# Patient Record
Sex: Male | Born: 1970 | Race: White | Hispanic: No | Marital: Single | Smoking: Current some day smoker
Health system: Southern US, Community
[De-identification: ages and names within clinical notes are randomized; demographics above are authoritative.]

## PROBLEM LIST (undated history)

## (undated) DIAGNOSIS — G4733 Obstructive sleep apnea (adult) (pediatric): Secondary | ICD-10-CM

## (undated) DIAGNOSIS — J45909 Unspecified asthma, uncomplicated: Secondary | ICD-10-CM

## (undated) DIAGNOSIS — F32A Depression, unspecified: Secondary | ICD-10-CM

## (undated) DIAGNOSIS — F419 Anxiety disorder, unspecified: Secondary | ICD-10-CM

## (undated) DIAGNOSIS — E119 Type 2 diabetes mellitus without complications: Secondary | ICD-10-CM

## (undated) DIAGNOSIS — J449 Chronic obstructive pulmonary disease, unspecified: Secondary | ICD-10-CM

## (undated) DIAGNOSIS — R569 Unspecified convulsions: Secondary | ICD-10-CM

## (undated) DIAGNOSIS — F329 Major depressive disorder, single episode, unspecified: Secondary | ICD-10-CM

## (undated) DIAGNOSIS — I10 Essential (primary) hypertension: Secondary | ICD-10-CM

## (undated) HISTORY — DX: Depression, unspecified: F32.A

## (undated) HISTORY — DX: Anxiety disorder, unspecified: F41.9

---
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
## 2018-12-02 ENCOUNTER — Emergency Department (HOSPITAL_COMMUNITY): Payer: Self-pay

## 2018-12-02 ENCOUNTER — Emergency Department (HOSPITAL_COMMUNITY)
Admission: EM | Admit: 2018-12-02 | Discharge: 2018-12-02 | Disposition: A | Discharge status: Home / Self Care | Payer: Self-pay | Attending: Emergency Medicine | Admitting: Emergency Medicine

## 2018-12-02 ENCOUNTER — Other Ambulatory Visit: Payer: Self-pay

## 2018-12-02 ENCOUNTER — Encounter (HOSPITAL_COMMUNITY): Payer: Self-pay | Admitting: *Deleted

## 2018-12-02 DIAGNOSIS — Z79899 Other long term (current) drug therapy: Secondary | ICD-10-CM | POA: Insufficient documentation

## 2018-12-02 DIAGNOSIS — J449 Chronic obstructive pulmonary disease, unspecified: Secondary | ICD-10-CM | POA: Insufficient documentation

## 2018-12-02 DIAGNOSIS — I10 Essential (primary) hypertension: Secondary | ICD-10-CM | POA: Insufficient documentation

## 2018-12-02 DIAGNOSIS — R109 Unspecified abdominal pain: Secondary | ICD-10-CM | POA: Insufficient documentation

## 2018-12-02 DIAGNOSIS — K529 Noninfective gastroenteritis and colitis, unspecified: Secondary | ICD-10-CM

## 2018-12-02 DIAGNOSIS — Z7984 Long term (current) use of oral hypoglycemic drugs: Secondary | ICD-10-CM | POA: Insufficient documentation

## 2018-12-02 DIAGNOSIS — J45909 Unspecified asthma, uncomplicated: Secondary | ICD-10-CM | POA: Insufficient documentation

## 2018-12-02 DIAGNOSIS — E860 Dehydration: Secondary | ICD-10-CM | POA: Insufficient documentation

## 2018-12-02 DIAGNOSIS — E119 Type 2 diabetes mellitus without complications: Secondary | ICD-10-CM | POA: Insufficient documentation

## 2018-12-02 HISTORY — DX: Unspecified asthma, uncomplicated: J45.909

## 2018-12-02 HISTORY — DX: Chronic obstructive pulmonary disease, unspecified: J44.9

## 2018-12-02 HISTORY — DX: Essential (primary) hypertension: I10

## 2018-12-02 HISTORY — DX: Type 2 diabetes mellitus without complications: E11.9

## 2018-12-02 LAB — URINALYSIS, ROUTINE W REFLEX MICROSCOPIC
Bacteria, UA: NONE SEEN
Bilirubin Urine: NEGATIVE
Glucose, UA: >=500 mg/dL — AB
Hgb urine dipstick: NEGATIVE
Ketones, ur: 20 mg/dL — AB
Leukocytes,Ua: NEGATIVE
Nitrite: NEGATIVE
Protein, ur: NEGATIVE mg/dL
Specific Gravity, Urine: >1.046 — ABNORMAL HIGH (ref 1.005–1.030)
pH: 6 (ref 5.0–8.0)

## 2018-12-02 LAB — CBC WITH DIFFERENTIAL/PLATELET
Abs Immature Granulocytes: 0.06 10*3/uL (ref 0.00–0.07)
Basophils Absolute: 0.1 10*3/uL (ref 0.0–0.1)
Basophils Relative: 0 %
Eosinophils Absolute: 0.2 10*3/uL (ref 0.0–0.5)
Eosinophils Relative: 1 %
HCT: 49.8 % (ref 39.0–52.0)
Hemoglobin: 16.3 g/dL (ref 13.0–17.0)
Immature Granulocytes: 0 %
Lymphocytes Relative: 6 %
Lymphs Abs: 1 10*3/uL (ref 0.7–4.0)
MCH: 29.7 pg (ref 26.0–34.0)
MCHC: 32.7 g/dL (ref 30.0–36.0)
MCV: 90.7 fL (ref 80.0–100.0)
Monocytes Absolute: 1.1 10*3/uL — ABNORMAL HIGH (ref 0.1–1.0)
Monocytes Relative: 6 %
Neutro Abs: 14.8 10*3/uL — ABNORMAL HIGH (ref 1.7–7.7)
Neutrophils Relative %: 87 %
Platelets: 376 10*3/uL (ref 150–400)
RBC: 5.49 MIL/uL (ref 4.22–5.81)
RDW: 12.2 % (ref 11.5–15.5)
WBC: 17.2 10*3/uL — ABNORMAL HIGH (ref 4.0–10.5)
nRBC: 0 % (ref 0.0–0.2)

## 2018-12-02 LAB — COMPREHENSIVE METABOLIC PANEL
ALT: 31 U/L (ref 0–44)
AST: 16 U/L (ref 15–41)
Albumin: 4.2 g/dL (ref 3.5–5.0)
Alkaline Phosphatase: 83 U/L (ref 38–126)
Anion gap: 14 (ref 5–15)
BUN: 14 mg/dL (ref 6–20)
CO2: 23 mmol/L (ref 22–32)
Calcium: 9 mg/dL (ref 8.9–10.3)
Chloride: 97 mmol/L — ABNORMAL LOW (ref 98–111)
Creatinine, Ser: 0.66 mg/dL (ref 0.61–1.24)
GFR calc Af Amer: >60 mL/min (ref >60)
GFR calc non Af Amer: >60 mL/min (ref >60)
Glucose, Bld: 381 mg/dL — ABNORMAL HIGH (ref 70–99)
Potassium: 3.9 mmol/L (ref 3.5–5.1)
Sodium: 134 mmol/L — ABNORMAL LOW (ref 135–145)
Total Bilirubin: 0.8 mg/dL (ref 0.3–1.2)
Total Protein: 7.3 g/dL (ref 6.5–8.1)

## 2018-12-02 LAB — RAPID URINE DRUG SCREEN, HOSP PERFORMED
Amphetamines: NOT DETECTED
Barbiturates: NOT DETECTED
Benzodiazepines: NOT DETECTED
Cocaine: NOT DETECTED
Opiates: POSITIVE — AB
Tetrahydrocannabinol: POSITIVE — AB

## 2018-12-02 LAB — LIPASE, BLOOD: Lipase: 27 U/L (ref 11–51)

## 2018-12-02 LAB — LACTIC ACID, PLASMA
Lactic Acid, Venous: 1.9 mmol/L (ref 0.5–1.9)
Lactic Acid, Venous: 1.7 mmol/L (ref 0.5–1.9)

## 2018-12-02 MED ORDER — INSULIN ASPART 100 UNIT/ML ~~LOC~~ SOLN
8.0000 [IU] | Freq: Once | SUBCUTANEOUS | Status: AC
  Administered 2018-12-02: 15:00:00 8 [IU] via SUBCUTANEOUS
  Filled 2018-12-02: qty 1

## 2018-12-02 MED ORDER — LORAZEPAM 1 MG PO TABS
1.0000 mg | ORAL_TABLET | Freq: Once | ORAL | Status: AC
  Administered 2018-12-02: 1 mg via ORAL
  Filled 2018-12-02: qty 1

## 2018-12-02 MED ORDER — MORPHINE SULFATE (PF) 2 MG/ML IV SOLN
2.0000 mg | Freq: Once | INTRAVENOUS | Status: AC
  Administered 2018-12-02: 12:00:00 2 mg via INTRAVENOUS
  Filled 2018-12-02: qty 1

## 2018-12-02 MED ORDER — ONDANSETRON HCL 4 MG/2ML IJ SOLN
4.0000 mg | Freq: Once | INTRAMUSCULAR | Status: AC
  Administered 2018-12-02: 4 mg via INTRAVENOUS
  Filled 2018-12-02: qty 2

## 2018-12-02 MED ORDER — IOHEXOL 300 MG/ML  SOLN
125.0000 mL | Freq: Once | INTRAMUSCULAR | Status: AC | PRN
  Administered 2018-12-02: 125 mL via INTRAVENOUS

## 2018-12-02 MED ORDER — MORPHINE SULFATE (PF) 2 MG/ML IV SOLN
2.0000 mg | Freq: Once | INTRAVENOUS | Status: AC
  Administered 2018-12-02: 2 mg via INTRAVENOUS
  Filled 2018-12-02: qty 1

## 2018-12-02 MED ORDER — DICYCLOMINE HCL 20 MG PO TABS: 20.0000 mg | ORAL_TABLET | Freq: Two times a day (BID) | ORAL | 0 refills | Status: DC

## 2018-12-02 MED ORDER — IOHEXOL 300 MG/ML  SOLN
30.0000 mL | Freq: Once | INTRAMUSCULAR | Status: AC | PRN
  Administered 2018-12-02: 12:00:00 30 mL via ORAL

## 2018-12-02 MED ORDER — DICYCLOMINE HCL 10 MG PO CAPS: 10.0000 mg | ORAL_CAPSULE | Freq: Once | ORAL | Status: DC

## 2018-12-02 MED ORDER — ONDANSETRON HCL 4 MG PO TABS: 4.0000 mg | ORAL_TABLET | Freq: Four times a day (QID) | ORAL | 0 refills | Status: DC

## 2018-12-02 MED ORDER — SODIUM CHLORIDE 0.9 % IV BOLUS
1000.0000 mL | Freq: Once | INTRAVENOUS | Status: AC
  Administered 2018-12-02: 12:00:00 1000 mL via INTRAVENOUS

## 2018-12-02 MED ORDER — DICYCLOMINE HCL 10 MG PO CAPS
20.0000 mg | ORAL_CAPSULE | Freq: Once | ORAL | Status: AC
  Administered 2018-12-02: 20 mg via ORAL
  Filled 2018-12-02: qty 2

## 2018-12-02 MED ORDER — SODIUM CHLORIDE 0.9 % IV BOLUS: 1000.0000 mL | Freq: Once | INTRAVENOUS | Status: DC

## 2018-12-02 MED ORDER — SODIUM CHLORIDE 0.9 % IV SOLN: INTRAVENOUS | Status: DC

## 2018-12-02 NOTE — ED Notes (Signed)
Patient transported to CT 

## 2018-12-02 NOTE — Discharge Instructions (Addendum)
Please review information on resources for primary care.  Please establish primary care in this area and restart your diabetes medications that are being mailed to you.  Please drink plenty of water.  Abstain from using marijuana. I prescribed you zofran for nausea and bentyl for the pain and stomach spasms.   Please return if you have any new or concerning symptoms such as fever, new or worsening pain, or new or concerning symptoms.

## 2018-12-02 NOTE — ED Triage Notes (Signed)
Patient presents to the ED for nausea, vomiting, diarrhea since today.

## 2018-12-02 NOTE — ED Provider Notes (Addendum)
[facility] EMERGENCY DEPARTMENT Provider Note   CSN: [id number] Arrival date & time: 12/02/18  1053     History   Chief Complaint Chief Complaint  Patient presents with  . Emesis    HPI Johnathan Smith is a 48 y.o. male history of asthma, diabetes, bipolar, COPD presenting for NV, abdominal pain and diarrhea.   Patient complains of sharp stabbing abdominal pain that started this morning he has since vomited x4 and had diarrhea x8 described as watery no blood in his emesis or diarrhea.  Patient states he also has abdominal distention since this morning and feels that his abdomen is tight.  States that he was hospitalized 2 months ago for similar presentation but is unsure what the diagnosis was although he was treated with antibiotics. States pain is better with laying down.    Patient states he drinks a sixpack of beer a week but approximately 2 weeks ago he was an everyday drinker.  States he intermittently smokes.  Denies any drug use.  Patient states that he recently moved to the area and states that some of his medications are being shipped to him.  States that he takes his Metformin every day but has not has his glipizide in 3 to 4 days. Patient states he urinates frequently as he is a diabetic and is uncertain of his blood sugar he does not have glucometer. Uncertain of when he last had bloodwork done.   Denies any chest pain, shortness of breath, headache, dizziness, no dysuria.     HPI  Past Medical History:  Diagnosis Date  . Asthma   . COPD (chronic obstructive pulmonary disease) (HCC)   . Diabetes mellitus without complication (HCC)   . Hypertension     There are no active problems to display for this patient.   History reviewed. No pertinent surgical history.      Home Medications    Prior to Admission medications   Medication Sig Start Date End Date Taking? Authorizing Provider  acetaminophen (TYLENOL) 325 MG tablet Take 650 mg by mouth every 6 (six) hours  as needed.   Yes [provider]  albuterol (VENTOLIN HFA) 108 (90 Base) MCG/ACT inhaler Inhale 1-2 puffs into the lungs every 6 (six) hours as needed for wheezing or shortness of breath.   Yes [provider]  atorvastatin (LIPITOR) 10 MG tablet Take 10 mg by mouth daily.   Yes [provider]  Dulaglutide (TRULICITY) 1.5 MG/0.5ML SOPN Inject 1.5 mg into the skin once a week.    Yes [provider]  glimepiride (AMARYL) 4 MG tablet Take 8 mg by mouth daily with breakfast.    Yes [provider]  lisinopril (ZESTRIL) 40 MG tablet Take 40 mg by mouth daily.   Yes [provider]  metFORMIN (GLUCOPHAGE) 1000 MG tablet Take 1,000 mg by mouth 2 (two) times daily with a meal.   Yes [provider]  mometasone-formoterol (DULERA) 200-5 MCG/ACT AERO Inhale 2 puffs into the lungs 2 (two) times daily.   Yes [provider]  omeprazole (PRILOSEC) 40 MG capsule Take 40 mg by mouth daily.   Yes [provider]  tiotropium (SPIRIVA) 18 MCG inhalation capsule Place 18 mcg into inhaler and inhale daily.   Yes [provider]  dicyclomine (BENTYL) 20 MG tablet Take 1 tablet (20 mg total) by mouth 2 (two) times daily. 12/02/18   Emeree Mahler, Rodrigo Ran, PA  ondansetron (ZOFRAN) 4 MG tablet Take 1 tablet (4 mg total)  by mouth every 6 (six) hours. 12/02/18   Tedd Sias, PA    Family History History reviewed. No pertinent family history.  Social History Social History   Tobacco Use  . Smoking status: Current Some Day Smoker  . Smokeless tobacco: Never Used  Substance Use Topics  . Alcohol use: Yes    Alcohol/week: 6.0 standard drinks    Types: 6 Cans of beer per week    Comment: a week  . Drug use: Yes    Types: Marijuana     Allergies   Banana   Review of Systems Review of Systems  Constitutional: Negative for fever.  HENT: Negative for congestion.   Respiratory: Negative for shortness of breath.    Cardiovascular: Negative for chest pain.  Gastrointestinal: Positive for abdominal distention, abdominal pain, diarrhea, nausea and vomiting.  Genitourinary: Positive for frequency. Negative for dysuria and urgency.  Musculoskeletal: Negative for neck pain.  Skin: Negative for rash.  Neurological: Negative for dizziness.     Physical Exam Updated Vital Signs BP 126/64 (BP Location: Left Wrist)   Pulse (!) 104   Temp 98.1 F (36.7 C) (Oral)   Resp (!) 22   Ht 5\' 9"  (1.753 m)   Wt 123.8 kg   SpO2 96%   BMI 40.32 kg/m   Physical Exam Vitals signs and nursing note reviewed.  Constitutional:      Appearance: He is not ill-appearing.  HENT:     Head: Normocephalic and atraumatic.  Eyes:     General: No scleral icterus. Neck:     Musculoskeletal: No neck rigidity.  Cardiovascular:     Rate and Rhythm: Regular rhythm. Tachycardia present.     Pulses: Normal pulses.     Heart sounds: Normal heart sounds.  Pulmonary:     Effort: Pulmonary effort is normal.     Breath sounds: Normal breath sounds.  Abdominal:     General: Abdomen is flat. There is distension.     Palpations: Abdomen is soft.     Tenderness: There is abdominal tenderness. There is guarding and rebound. There is no right CVA tenderness or left CVA tenderness.     Comments: Umbilical tenderness, guarding, rebound.  Abdomen is distended without rigidity.  Musculoskeletal:     Right lower leg: No edema.     Left lower leg: No edema.  Skin:    General: Skin is warm and dry.     Capillary Refill: Capillary refill takes less than 2 seconds.  Neurological:     Mental Status: He is alert. Mental status is at baseline.  Psychiatric:        Behavior: Behavior normal.      ED Treatments / Results  Labs (all labs ordered are listed, but only abnormal results are displayed) Labs Reviewed  COMPREHENSIVE METABOLIC PANEL - Abnormal; Notable for the following components:      Result Value   Sodium 134 (*)     Chloride 97 (*)    Glucose, Bld 381 (*)    All other components within normal limits  CBC WITH DIFFERENTIAL/PLATELET - Abnormal; Notable for the following components:   WBC 17.2 (*)    Neutro Abs 14.8 (*)    Monocytes Absolute 1.1 (*)    All other components within normal limits  URINALYSIS, ROUTINE W REFLEX MICROSCOPIC - Abnormal; Notable for the following components:   Specific Gravity, Urine >1.046 (*)    Glucose, UA >=500 (*)    Ketones, ur 20 (*)  All other components within normal limits  RAPID URINE DRUG SCREEN, HOSP PERFORMED - Abnormal; Notable for the following components:   Opiates POSITIVE (*)    Tetrahydrocannabinol POSITIVE (*)    All other components within normal limits  LIPASE, BLOOD  LACTIC ACID, PLASMA  LACTIC ACID, PLASMA    EKG None  Radiology Ct Abdomen Pelvis W Contrast  Result Date: 12/02/2018 CLINICAL DATA:  Abdominal pain with nausea and vomiting oral contrast was also administered. EXAM: CT ABDOMEN AND PELVIS WITH CONTRAST TECHNIQUE: Multidetector CT imaging of the abdomen and pelvis was performed using the standard protocol following bolus administration of intravenous contrast. CONTRAST:  OMNIPAQUE IOHEXOL 300 MG/ML  SOLN COMPARISON:  None. FINDINGS: Lower chest: There is no lung base edema or consolidation. There is slight lower lobe bronchiectatic change bilaterally. Hepatobiliary: No focal liver lesions are apparent. Gallbladder wall is not appreciably thickened. There is no biliary duct dilatation. Pancreas: There is no pancreatic mass or inflammatory focus. Spleen: No splenic lesions are evident. Adrenals/Urinary Tract: Adrenals bilaterally appear unremarkable. Kidneys bilaterally show no evident mass or hydronephrosis on either side. There is a 2 mm calculus in the lower pole of the right kidney. No ureteral calculi evident on either side. Urinary bladder is midline with wall thickness within normal limits. Stomach/Bowel: Most small bowel loops  are fluid filled. There is wall thickening throughout multiple loops of distal jejunum and ileum. There is no transition zone to suggest bowel obstruction. The terminal ileum appears within normal limits. There are occasional sigmoid diverticula without diverticulitis. There is no free air or portal venous air. Vascular/Lymphatic: There is no abdominal aortic aneurysm. No vascular lesions are evident. There is a circumaortic left renal vein, an anatomic variant. Major venous structures appear patent. There is no adenopathy in the abdomen or pelvis. Reproductive: Prostate and seminal vesicles are normal in size and contour. There are a few tiny prostatic calculi. No evident pelvic mass. Other: The appendix appears normal. There is no abscess or ascites in the abdomen or pelvis. There is a small amount of fat in the umbilicus. Musculoskeletal: There is vacuum phenomenon at L5-S1. No blastic or lytic bone lesions are evident. There is no intramuscular lesions. IMPRESSION: 1. There is wall thickening throughout multiple loops of distal jejunum and ileum with no transition zone to suggest bowel obstruction. This could represent an infectious or inflammatory enteritis, potentially with a degree of ileus. 2. There are occasional sigmoid diverticula without evidence of diverticulitis. 3. 2 mm calculus lower pole right kidney. No hydronephrosis or ureteral calculus on either side. Urinary bladder wall thickness normal. Tiny prostatic calculi evident. 4. Appendix appears normal. Electronically Signed   By: Bretta Bang III M.D.   On: 12/02/2018 13:35    Procedures Procedures (including critical care time)  Medications Ordered in ED Medications  sodium chloride 0.9 % bolus 1,000 mL (0 mLs Intravenous Stopped 12/02/18 1313)    And  0.9 %  sodium chloride infusion ( Intravenous Stopped 12/02/18 1442)  sodium chloride 0.9 % bolus 1,000 mL ( Intravenous Rate/Dose Change 12/02/18 1437)  LORazepam (ATIVAN) tablet 1  mg (has no administration in time range)  dicyclomine (BENTYL) capsule 20 mg (has no administration in time range)  morphine 2 MG/ML injection 2 mg (2 mg Intravenous Given 12/02/18 1144)  ondansetron (ZOFRAN) injection 4 mg (4 mg Intravenous Given 12/02/18 1144)  iohexol (OMNIPAQUE) 300 MG/ML solution 30 mL (30 mLs Oral Contrast Given 12/02/18 1153)  iohexol (OMNIPAQUE) 300 MG/ML solution 125  mL (125 mLs Intravenous Contrast Given 12/02/18 1311)  morphine 2 MG/ML injection 2 mg (2 mg Intravenous Given 12/02/18 1358)  insulin aspart (novoLOG) injection 8 Units (8 Units Subcutaneous Given 12/02/18 1438)     Initial Impression / Assessment and Plan / ED Course  I have reviewed the triage vital signs and the nursing notes.  Pertinent labs & imaging results that were available during my care of the patient were reviewed by me and considered in my medical decision making (see chart for details).        Patient with vomiting diarrhea and abdominal pain that began today.  CT imaging ordered as patient has abdominal tenderness and guarding in the umbilical area with diffuse abdominal pain and states that he is more distended than usual with White count elevated at 17.2.   CT abdomen pelvis shows small bowel wall thickening consistent with infectious or inflammatory enteritis; CT imaging is reassuring with no obstructive pathology of kidneys, small intrarenal calculi only, normal appendix.  CT imaging noted to be consistent with ileus however patient does not clinically appear to have this as he has numerous episodes of diarrhea, no constipation, no obstipation.  Doubt mesenteric ischemia as patient has lactate within normal limits.  Patient has history of diabetes and states he has not taking all of his medications currently since they are being shipped to him.  Patient given subcu Lantus during ED visit as he has elevated glucose.  Patient also rehydrated.  Patient will restart his diabetes  medications and use consistently.  Patient also positive for THC may be because of GI upset with WBC elevation due to reactive release as result of vomiting.  Either way, patient is likely experiencing symptoms related to enteritis of viral nature or result of THC and uncontrolled diabetes.  Patient given p.o. Bentyl and IV morphine.  Due to short duration of symptoms-we will treat patient conservatively no need for antibiotics at this time patient is afebrile.  Will IV hydrate with 2 L and discharge if able to tolerate p.o.  Counseled patient extensively on THC use, need for follow-up and adherence to outpatient plan for diabetes management.  Patient discharged with Zofran and Bentyl prescriptions.  Shared decision making discussion with patient over pros and cons of antibiotic treatment.  Counseled patient to return if he experiences worsening pain and fever as this may indicate bacterial infection.  Otherwise patient will treat symptomatically hydrate appropriately.  Patient states he will be able to restart his diabetes medications on Monday and is taking his Metformin currently.   Patient is tachycardic secondary to pain.  CT reassuring and patient is afebrile with no antipyretics.  Doubt sepsis, likely tachycardia secondary to pain. Discussed strict return precautions with patient and close follow up.   Abdominal exam repeated. Patient still complains of pain however pulse is 100 improved since NS bolus. Patient still appears anxious. BS present, no guarding or rebound at this time. Still has tenderness over left abdomen. Patient appears improved from original presentation.    Final Clinical Impressions(s) / ED Diagnoses   Final diagnoses:  Dehydration  Enteritis    ED Discharge Orders         Ordered    dicyclomine (BENTYL) 20 MG tablet  2 times daily     12/02/18 1455    ondansetron (ZOFRAN) 4 MG tablet  Every 6 hours     12/02/18 1459           Solon Augusta [place], [place] 12/02/18  1509    Cathren LaineSteinl, Kevin, MD 12/02/18 [id number]    Roel Douthat [place], [place] 12/02/18 1555    Cathren LaineSteinl, Kevin, MD 12/05/18 1422

## 2018-12-09 ENCOUNTER — Other Ambulatory Visit: Payer: Self-pay

## 2018-12-09 ENCOUNTER — Observation Stay (HOSPITAL_COMMUNITY)
Admission: EM | Admit: 2018-12-09 | Discharge: 2018-12-11 | Disposition: A | Discharge status: Home / Self Care | Payer: Self-pay | Attending: Family Medicine | Admitting: Family Medicine

## 2018-12-09 ENCOUNTER — Encounter (HOSPITAL_COMMUNITY): Payer: Self-pay

## 2018-12-09 ENCOUNTER — Emergency Department (HOSPITAL_COMMUNITY): Payer: Self-pay

## 2018-12-09 DIAGNOSIS — J9691 Respiratory failure, unspecified with hypoxia: Secondary | ICD-10-CM | POA: Diagnosis present

## 2018-12-09 DIAGNOSIS — E785 Hyperlipidemia, unspecified: Secondary | ICD-10-CM | POA: Insufficient documentation

## 2018-12-09 DIAGNOSIS — J449 Chronic obstructive pulmonary disease, unspecified: Secondary | ICD-10-CM | POA: Insufficient documentation

## 2018-12-09 DIAGNOSIS — Y906 Blood alcohol level of 120-199 mg/100 ml: Secondary | ICD-10-CM | POA: Insufficient documentation

## 2018-12-09 DIAGNOSIS — Z7984 Long term (current) use of oral hypoglycemic drugs: Secondary | ICD-10-CM | POA: Insufficient documentation

## 2018-12-09 DIAGNOSIS — R569 Unspecified convulsions: Secondary | ICD-10-CM

## 2018-12-09 DIAGNOSIS — Z7951 Long term (current) use of inhaled steroids: Secondary | ICD-10-CM | POA: Insufficient documentation

## 2018-12-09 DIAGNOSIS — F17200 Nicotine dependence, unspecified, uncomplicated: Secondary | ICD-10-CM | POA: Insufficient documentation

## 2018-12-09 DIAGNOSIS — E876 Hypokalemia: Secondary | ICD-10-CM | POA: Insufficient documentation

## 2018-12-09 DIAGNOSIS — G4733 Obstructive sleep apnea (adult) (pediatric): Secondary | ICD-10-CM | POA: Diagnosis present

## 2018-12-09 DIAGNOSIS — K219 Gastro-esophageal reflux disease without esophagitis: Secondary | ICD-10-CM | POA: Insufficient documentation

## 2018-12-09 DIAGNOSIS — K760 Fatty (change of) liver, not elsewhere classified: Secondary | ICD-10-CM | POA: Insufficient documentation

## 2018-12-09 DIAGNOSIS — R55 Syncope and collapse: Secondary | ICD-10-CM

## 2018-12-09 DIAGNOSIS — R197 Diarrhea, unspecified: Secondary | ICD-10-CM | POA: Insufficient documentation

## 2018-12-09 DIAGNOSIS — F10920 Alcohol use, unspecified with intoxication, uncomplicated: Secondary | ICD-10-CM

## 2018-12-09 DIAGNOSIS — J9601 Acute respiratory failure with hypoxia: Secondary | ICD-10-CM | POA: Insufficient documentation

## 2018-12-09 DIAGNOSIS — Z20828 Contact with and (suspected) exposure to other viral communicable diseases: Secondary | ICD-10-CM | POA: Insufficient documentation

## 2018-12-09 DIAGNOSIS — Z23 Encounter for immunization: Secondary | ICD-10-CM | POA: Insufficient documentation

## 2018-12-09 DIAGNOSIS — I10 Essential (primary) hypertension: Secondary | ICD-10-CM | POA: Insufficient documentation

## 2018-12-09 DIAGNOSIS — J9622 Acute and chronic respiratory failure with hypercapnia: Secondary | ICD-10-CM

## 2018-12-09 DIAGNOSIS — Z79899 Other long term (current) drug therapy: Secondary | ICD-10-CM | POA: Insufficient documentation

## 2018-12-09 DIAGNOSIS — J9602 Acute respiratory failure with hypercapnia: Principal | ICD-10-CM | POA: Insufficient documentation

## 2018-12-09 DIAGNOSIS — N200 Calculus of kidney: Secondary | ICD-10-CM | POA: Insufficient documentation

## 2018-12-09 DIAGNOSIS — Z6841 Body Mass Index (BMI) 40.0 and over, adult: Secondary | ICD-10-CM | POA: Insufficient documentation

## 2018-12-09 DIAGNOSIS — E119 Type 2 diabetes mellitus without complications: Secondary | ICD-10-CM | POA: Insufficient documentation

## 2018-12-09 DIAGNOSIS — F10929 Alcohol use, unspecified with intoxication, unspecified: Secondary | ICD-10-CM | POA: Diagnosis present

## 2018-12-09 HISTORY — DX: Obstructive sleep apnea (adult) (pediatric): G47.33

## 2018-12-09 HISTORY — DX: Unspecified convulsions: R56.9

## 2018-12-09 MED ORDER — ONDANSETRON HCL 4 MG/2ML IJ SOLN
4.0000 mg | Freq: Once | INTRAMUSCULAR | Status: AC
  Administered 2018-12-10: 4 mg via INTRAVENOUS
  Filled 2018-12-09: qty 2

## 2018-12-09 MED ORDER — SODIUM CHLORIDE 0.9 % IV BOLUS
1000.0000 mL | Freq: Once | INTRAVENOUS | Status: AC
  Administered 2018-12-10: 1000 mL via INTRAVENOUS

## 2018-12-09 NOTE — ED Triage Notes (Signed)
EMS reports they were called out to [facility] in [place] for seizure like activity. PT reports having abdominal pain that has been persistent since his last visit. Pt alert and oriented x 4 at this time. Pt reports having 4 beers tonight. Pt says he is not taking seizure medication.

## 2018-12-09 NOTE — ED Notes (Signed)
Dr. Rancour at bedside. 

## 2018-12-09 NOTE — ED Provider Notes (Signed)
[facility] EMERGENCY DEPARTMENT Provider Note   CSN: [id number] Arrival date & time: 12/09/18  2324     History   Chief Complaint Chief Complaint  Patient presents with  . Seizures  . Abdominal Pain    HPI Johnathan Smith is a 48 y.o. male.     Level 5 caveat for intoxication and altered mental status.  Patient brought in by EMS after witnessed seizure-like activity at a bar.  Patient admits to drinking about 7 alcoholic drinks today.  He states he "had a seizure" and was shaking all over.  No tongue biting or incontinence.  Denies hitting his head or losing consciousness.  States he has had seizures in the past due to "stress" but does not take any seizure medication.  His last seizure episode was 3 months ago.  He has never seen a neurologist.  Endorses alcohol use but no illicit drug use.  No chest pain or shortness of breath.  Complains of ongoing abdominal pain since his last ED visit on October 16.  States he has persistent nausea and vomiting about 5 or 6 times a day with multiple episodes of diarrhea as well.  No fever.  He had a CT scan on October 16 and was treated for viral enteritis at that time.  Patient admits to marijuana use but no other illicit drug use.  The history is provided by the patient.  Seizures Abdominal Pain Associated symptoms: diarrhea, fatigue, nausea and vomiting   Associated symptoms: no cough, no dysuria, no fever, no hematuria and no shortness of breath     Past Medical History:  Diagnosis Date  . Asthma   . COPD (chronic obstructive pulmonary disease) (HCC)   . Diabetes mellitus without complication (HCC)   . Hypertension     There are no active problems to display for this patient.   No past surgical history on file.      Home Medications    Prior to Admission medications   Medication Sig Start Date End Date Taking? Authorizing Provider  acetaminophen (TYLENOL) 325 MG tablet Take 650 mg by mouth every 6 (six) hours as needed.     [provider]  albuterol (VENTOLIN HFA) 108 (90 Base) MCG/ACT inhaler Inhale 1-2 puffs into the lungs every 6 (six) hours as needed for wheezing or shortness of breath.    [provider]  atorvastatin (LIPITOR) 10 MG tablet Take 10 mg by mouth daily.    [provider]  dicyclomine (BENTYL) 20 MG tablet Take 1 tablet (20 mg total) by mouth 2 (two) times daily. 12/02/18   Fondaw, Rodrigo Ran, PA  Dulaglutide (TRULICITY) 1.5 MG/0.5ML SOPN Inject 1.5 mg into the skin once a week.     [provider]  glimepiride (AMARYL) 4 MG tablet Take 8 mg by mouth daily with breakfast.     [provider]  lisinopril (ZESTRIL) 40 MG tablet Take 40 mg by mouth daily.    [provider]  metFORMIN (GLUCOPHAGE) 1000 MG tablet Take 1,000 mg by mouth 2 (two) times daily with a meal.    [provider]  mometasone-formoterol (DULERA) 200-5 MCG/ACT AERO Inhale 2 puffs into the lungs 2 (two) times daily.    [provider]  omeprazole (PRILOSEC) 40 MG capsule Take 40 mg by mouth daily.    [provider]  ondansetron (ZOFRAN) 4 MG tablet Take 1 tablet (4 mg total) by mouth every 6 (six) hours. 12/02/18   Gailen Shelter, PA  tiotropium (SPIRIVA) 18 MCG inhalation capsule Place 18 mcg into inhaler and inhale daily.    [provider]    Family History No family history on file.  Social History Social History   Tobacco Use  . Smoking status: Current Some Day Smoker  . Smokeless tobacco: Never Used  Substance Use Topics  . Alcohol use: Yes    Alcohol/week: 6.0 standard drinks    Types: 6 Cans of beer per week    Comment: a week  . Drug use: Yes    Types: Marijuana     Allergies   Banana   Review of Systems Review of Systems  Constitutional: Positive for activity change, appetite change and fatigue. Negative for fever.  HENT: Negative for congestion and rhinorrhea.   Eyes: Negative for photophobia.  Respiratory:  Negative for cough, chest tightness and shortness of breath.   Gastrointestinal: Positive for abdominal pain, diarrhea, nausea and vomiting.  Genitourinary: Negative for dysuria and hematuria.  Musculoskeletal: Positive for arthralgias and myalgias.  Skin: Negative for rash.  Neurological: Positive for seizures.    all other systems are negative except as noted in the HPI and PMH.    Physical Exam Updated Vital Signs BP 118/66   Pulse 88   Temp 98.5 F (36.9 C) (Rectal)   Resp 19   Wt 123.8 kg   SpO2 91%   BMI 40.30 kg/m   Physical Exam Vitals signs and nursing note reviewed.  Constitutional:      General: He is not in acute distress.    Appearance: He is well-developed. He is obese.     Comments: Oriented x3.  HENT:     Head: Normocephalic and atraumatic.     Mouth/Throat:     Pharynx: No oropharyngeal exudate.     Comments: No tongue trauma Eyes:     Conjunctiva/sclera: Conjunctivae normal.     Pupils: Pupils are equal, round, and reactive to light.  Neck:     Musculoskeletal: Normal range of motion and neck supple.     Comments: No meningismus. Cardiovascular:     Rate and Rhythm: Normal rate and regular rhythm.     Heart sounds: Normal heart sounds. No murmur.  Pulmonary:     Effort: Pulmonary effort is normal. No respiratory distress.     Breath sounds: Normal breath sounds.  Abdominal:     General: There is distension.     Palpations: Abdomen is soft.     Tenderness: There is abdominal tenderness. There is guarding. There is no rebound.     Comments: Distended abdomen, diffuse tenderness with voluntary guarding  Musculoskeletal: Normal range of motion.        General: No tenderness.  Skin:    General: Skin is warm.  Neurological:     Mental Status: He is alert and oriented to person, place, and time.     Cranial Nerves: No cranial nerve deficit.     Motor: No abnormal muscle tone.     Coordination: Coordination normal.     Comments: No ataxia on finger  to nose bilaterally. No pronator drift. 5/5 strength throughout. CN 2-12 intact.Equal grip strength. Sensation intact.   Psychiatric:        Behavior: Behavior normal.      ED Treatments / Results  Labs (all labs ordered are listed, but only abnormal results are displayed) Labs Reviewed  CBC WITH DIFFERENTIAL/PLATELET - Abnormal; Notable for the following components:      Result Value   Abs  Immature Granulocytes 0.34 (*)    All other components within normal limits  COMPREHENSIVE METABOLIC PANEL - Abnormal; Notable for the following components:   Potassium 3.1 (*)    Glucose, Bld 336 (*)    AST 14 (*)    All other components within normal limits  URINALYSIS, ROUTINE W REFLEX MICROSCOPIC - Abnormal; Notable for the following components:   Color, Urine COLORLESS (*)    Specific Gravity, Urine 1.002 (*)    Glucose, UA 150 (*)    All other components within normal limits  ETHANOL - Abnormal; Notable for the following components:   Alcohol, Ethyl (B) 137 (*)    All other components within normal limits  LIPASE, BLOOD  RAPID URINE DRUG SCREEN, HOSP PERFORMED  TROPONIN I (HIGH SENSITIVITY)  TROPONIN I (HIGH SENSITIVITY)    EKG EKG Interpretation  Date/Time:  Saturday December 10 2018 00:39:09 EDT Ventricular Rate:  93 PR Interval:    QRS Duration: 110 QT Interval:  373 QTC Calculation: 464 R Axis:   82 Text Interpretation:  Sinus rhythm Baseline wander in lead(s) V3 No previous ECGs available Confirmed by Glynn Octave [phone]) on 12/10/2018 12:50:20 AM   Radiology Ct Head Wo Contrast  Result Date: 12/10/2018 CLINICAL DATA:  Encephalopathy EXAM: CT HEAD WITHOUT CONTRAST TECHNIQUE: Contiguous axial images were obtained from the base of the skull through the vertex without intravenous contrast. COMPARISON:  None. FINDINGS: Limited due to patient motion. Brain: No evidence of acute territorial infarction, hemorrhage, hydrocephalus,extra-axial collection or mass lesion/mass  effect. Normal gray-white differentiation. Ventricles are normal in size and contour. Vascular: No hyperdense vessel or unexpected calcification. Skull: The skull is intact. No fracture or focal lesion identified. Sinuses/Orbits: The visualized paranasal sinuses and mastoid air cells are clear. The orbits and globes intact. Other: None IMPRESSION: Somewhat limited due to patient motion. No acute intracranial abnormality. Electronically Signed   By: Jonna Clark M.D.   On: 12/10/2018 02:47   Ct Abdomen Pelvis W Contrast  Result Date: 12/10/2018 CLINICAL DATA:  48 year old male with altered mental status abdominal pain. EXAM: CT ABDOMEN AND PELVIS WITH CONTRAST TECHNIQUE: Multidetector CT imaging of the abdomen and pelvis was performed using the standard protocol following bolus administration of intravenous contrast. CONTRAST:  OMNIPAQUE IOHEXOL 300 MG/ML  SOLN COMPARISON:  CT of the abdomen pelvis dated 12/02/2018 FINDINGS: Lower chest: The visualized lung bases are clear. Punctate left lung base calcified granuloma. No intra-abdominal free air or free fluid. Hepatobiliary: Apparent fatty infiltration of the liver. No intrahepatic biliary ductal dilatation. The gallbladder is unremarkable. Pancreas: Unremarkable. No pancreatic ductal dilatation or surrounding inflammatory changes. Spleen: Normal in size without focal abnormality. Adrenals/Urinary Tract: The adrenal glands are unremarkable. There is a 3 mm nonobstructing right renal inferior pole calculus. No hydronephrosis. Slight heterogeneous and edematous appearance of the right kidney. Correlation with urinalysis recommended to exclude pyelonephritis. The left kidney is unremarkable. The visualized ureters appear unremarkable. The urinary bladder is minimally distended and grossly unremarkable. Stomach/Bowel: There is no bowel obstruction or active inflammation. The appendix is normal. Vascular/Lymphatic: The abdominal aorta and IVC are grossly  unremarkable. No portal venous gas. There is no adenopathy. Reproductive: The prostate and seminal vesicles are grossly unremarkable. No pelvic mass. Other: Small fat containing umbilical hernia. Musculoskeletal: Disc desiccation and vacuum phenomena at L5-S1. No acute osseous pathology. IMPRESSION: 1. A 3 mm nonobstructing right renal inferior pole calculus. Slight heterogeneous and edematous appearance of the right kidney noted. Correlation with urinalysis recommended to exclude  pyelonephritis. No drainable fluid collection/abscess. 2. No bowel obstruction or active inflammation. Normal appendix. 3. Fatty liver. Electronically Signed   By: Anner Crete M.D.   On: 12/10/2018 02:51   Dg Chest Portable 1 View  Result Date: 12/10/2018 CLINICAL DATA:  48 year old male with altered mental status. EXAM: PORTABLE CHEST 1 VIEW COMPARISON:  None. FINDINGS: The heart size and mediastinal contours are within normal limits. Both lungs are clear. The visualized skeletal structures are unremarkable. IMPRESSION: No active disease. Electronically Signed   By: Anner Crete M.D.   On: 12/10/2018 02:45    Procedures Procedures (including critical care time)  Medications Ordered in ED Medications  sodium chloride 0.9 % bolus 1,000 mL (has no administration in time range)  ondansetron (ZOFRAN) injection 4 mg (has no administration in time range)     Initial Impression / Assessment and Plan / ED Course  I have reviewed the triage vital signs and the nursing notes.  Pertinent labs & imaging results that were available during my care of the patient were reviewed by me and considered in my medical decision making (see chart for details).       Patient with possible seizure while drinking alcohol tonight.  He also complains of ongoing abdominal pain since his ED visit last week.  Labs are remarkable for alcohol intoxication. CT head and chest x-ray yare negative.  Patient loaded with IV Keppra given  history of previous seizures by patient report. UDS sent.   Imaging shows renal calculus without other acute pathology.   Patient significantly obtunded that worsened throughout ED stay. O2 saturations in 80s on RA and placed on nasal cannula.  ABG with hypercapnea and respiratory acidosis. Suspect obesity hypoventilation syndrome with untreated sleep apnea and alcohol intoxication. Placed on bipap, covid negative.   Protecting airway, does not need to be intubated at this time.  No seizure activity seen in the ED.  With ongoing respiratory acidosis, will plan for admission on bipap. Does have history of COPD but no wheezing on exam.   D.w Dr. Maudie Mercury  CRITICAL CARE Performed by: Ezequiel Essex Total critical care time: 45 minutes Critical care time was exclusive of separately billable procedures and treating other patients. Critical care was necessary to treat or prevent imminent or life-threatening deterioration. Critical care was time spent personally by me on the following activities: development of treatment plan with patient and/or surrogate as well as nursing, discussions with consultants, evaluation of patient's response to treatment, examination of patient, obtaining history from patient or surrogate, ordering and performing treatments and interventions, ordering and review of laboratory studies, ordering and review of radiographic studies, pulse oximetry and re-evaluation of patient's condition.  Jayten Gabbard was evaluated in Emergency Department on 12/10/2018 for the symptoms described in the history of present illness. He was evaluated in the context of the global COVID-19 pandemic, which necessitated consideration that the patient might be at risk for infection with the SARS-CoV-2 virus that causes COVID-19. Institutional protocols and algorithms that pertain to the evaluation of patients at risk for COVID-19 are in a state of rapid change based on information released by regulatory  bodies including the CDC and federal and state organizations. These policies and algorithms were followed during the patient's care in the ED.   Final Clinical Impressions(s) / ED Diagnoses   Final diagnoses:  Acute on chronic respiratory failure with hypercapnia ([place])  Seizure-like activity ([place])  Alcoholic intoxication without complication [facility])    ED Discharge Orders  None       Glynn Octave, MD 12/10/18 [phone]

## 2018-12-10 ENCOUNTER — Emergency Department (HOSPITAL_COMMUNITY): Payer: Self-pay

## 2018-12-10 ENCOUNTER — Encounter (HOSPITAL_COMMUNITY): Payer: Self-pay | Admitting: Internal Medicine

## 2018-12-10 DIAGNOSIS — R569 Unspecified convulsions: Secondary | ICD-10-CM

## 2018-12-10 DIAGNOSIS — J9601 Acute respiratory failure with hypoxia: Secondary | ICD-10-CM

## 2018-12-10 DIAGNOSIS — F10929 Alcohol use, unspecified with intoxication, unspecified: Secondary | ICD-10-CM | POA: Diagnosis present

## 2018-12-10 DIAGNOSIS — G4733 Obstructive sleep apnea (adult) (pediatric): Secondary | ICD-10-CM | POA: Diagnosis present

## 2018-12-10 DIAGNOSIS — J9602 Acute respiratory failure with hypercapnia: Principal | ICD-10-CM

## 2018-12-10 DIAGNOSIS — F10920 Alcohol use, unspecified with intoxication, uncomplicated: Secondary | ICD-10-CM

## 2018-12-10 DIAGNOSIS — J9691 Respiratory failure, unspecified with hypoxia: Secondary | ICD-10-CM | POA: Diagnosis present

## 2018-12-10 LAB — CBC WITH DIFFERENTIAL/PLATELET
Abs Immature Granulocytes: 0.34 10*3/uL — ABNORMAL HIGH (ref 0.00–0.07)
Basophils Absolute: 0.1 10*3/uL (ref 0.0–0.1)
Basophils Relative: 1 %
Eosinophils Absolute: 0.4 10*3/uL (ref 0.0–0.5)
Eosinophils Relative: 4 %
HCT: 41.6 % (ref 39.0–52.0)
Hemoglobin: 13.5 g/dL (ref 13.0–17.0)
Immature Granulocytes: 4 %
Lymphocytes Relative: 27 %
Lymphs Abs: 2.5 10*3/uL (ref 0.7–4.0)
MCH: 29.5 pg (ref 26.0–34.0)
MCHC: 32.5 g/dL (ref 30.0–36.0)
MCV: 90.8 fL (ref 80.0–100.0)
Monocytes Absolute: 0.7 10*3/uL (ref 0.1–1.0)
Monocytes Relative: 8 %
Neutro Abs: 5.2 10*3/uL (ref 1.7–7.7)
Neutrophils Relative %: 56 %
Platelets: 399 10*3/uL (ref 150–400)
RBC: 4.58 MIL/uL (ref 4.22–5.81)
RDW: 12.2 % (ref 11.5–15.5)
WBC: 9.2 10*3/uL (ref 4.0–10.5)
nRBC: 0 % (ref 0.0–0.2)

## 2018-12-10 LAB — SARS CORONAVIRUS 2 BY RT PCR (HOSPITAL ORDER, PERFORMED IN ~~LOC~~ HOSPITAL LAB): SARS Coronavirus 2: NEGATIVE

## 2018-12-10 LAB — COMPREHENSIVE METABOLIC PANEL
ALT: 23 U/L (ref 0–44)
AST: 14 U/L — ABNORMAL LOW (ref 15–41)
Albumin: 3.6 g/dL (ref 3.5–5.0)
Alkaline Phosphatase: 74 U/L (ref 38–126)
Anion gap: 11 (ref 5–15)
BUN: 6 mg/dL (ref 6–20)
CO2: 26 mmol/L (ref 22–32)
Calcium: 9.4 mg/dL (ref 8.9–10.3)
Chloride: 100 mmol/L (ref 98–111)
Creatinine, Ser: 0.61 mg/dL (ref 0.61–1.24)
GFR calc Af Amer: >60 mL/min (ref >60)
GFR calc non Af Amer: >60 mL/min (ref >60)
Glucose, Bld: 336 mg/dL — ABNORMAL HIGH (ref 70–99)
Potassium: 3.1 mmol/L — ABNORMAL LOW (ref 3.5–5.1)
Sodium: 137 mmol/L (ref 135–145)
Total Bilirubin: 0.5 mg/dL (ref 0.3–1.2)
Total Protein: 6.6 g/dL (ref 6.5–8.1)

## 2018-12-10 LAB — URINALYSIS, ROUTINE W REFLEX MICROSCOPIC
Bilirubin Urine: NEGATIVE
Glucose, UA: 150 mg/dL — AB
Hgb urine dipstick: NEGATIVE
Ketones, ur: NEGATIVE mg/dL
Leukocytes,Ua: NEGATIVE
Nitrite: NEGATIVE
Protein, ur: NEGATIVE mg/dL
Specific Gravity, Urine: 1.002 — ABNORMAL LOW (ref 1.005–1.030)
pH: 7 (ref 5.0–8.0)

## 2018-12-10 LAB — BLOOD GAS, ARTERIAL
Acid-Base Excess: 0.5 mmol/L (ref 0.0–2.0)
Bicarbonate: 22.9 mmol/L (ref 20.0–28.0)
FIO2: 32
O2 Saturation: 92.8 %
Patient temperature: 37
pCO2 arterial: 70.7 mmHg (ref 32.0–48.0)
pH, Arterial: 7.214 — ABNORMAL LOW (ref 7.350–7.450)
pO2, Arterial: 85.7 mmHg (ref 83.0–108.0)

## 2018-12-10 LAB — RAPID URINE DRUG SCREEN, HOSP PERFORMED
Amphetamines: NOT DETECTED
Barbiturates: NOT DETECTED
Benzodiazepines: NOT DETECTED
Cocaine: NOT DETECTED
Opiates: NOT DETECTED
Tetrahydrocannabinol: NOT DETECTED

## 2018-12-10 LAB — LIPASE, BLOOD: Lipase: 47 U/L (ref 11–51)

## 2018-12-10 LAB — TROPONIN I (HIGH SENSITIVITY)
Troponin I (High Sensitivity): 6 ng/L (ref <18)
Troponin I (High Sensitivity): 7 ng/L (ref <18)

## 2018-12-10 LAB — GLUCOSE, CAPILLARY: Glucose-Capillary: 219 mg/dL — ABNORMAL HIGH (ref 70–99)

## 2018-12-10 LAB — CBG MONITORING, ED
Glucose-Capillary: 283 mg/dL — ABNORMAL HIGH (ref 70–99)
Glucose-Capillary: 227 mg/dL — ABNORMAL HIGH (ref 70–99)
Glucose-Capillary: 181 mg/dL — ABNORMAL HIGH (ref 70–99)

## 2018-12-10 LAB — ETHANOL: Alcohol, Ethyl (B): 137 mg/dL — ABNORMAL HIGH (ref <10)

## 2018-12-10 MED ORDER — INSULIN ASPART 100 UNIT/ML ~~LOC~~ SOLN
0.0000 [IU] | Freq: Three times a day (TID) | SUBCUTANEOUS | Status: DC
  Administered 2018-12-10: 2 [IU] via SUBCUTANEOUS
  Administered 2018-12-10: 3 [IU] via SUBCUTANEOUS
  Administered 2018-12-10 – 2018-12-11 (×2): 5 [IU] via SUBCUTANEOUS
  Administered 2018-12-11: 12:00:00 3 [IU] via SUBCUTANEOUS
  Filled 2018-12-10 (×2): qty 1

## 2018-12-10 MED ORDER — LEVETIRACETAM IN NACL 1000 MG/100ML IV SOLN
1000.0000 mg | Freq: Once | INTRAVENOUS | Status: AC
  Administered 2018-12-10: 1000 mg via INTRAVENOUS
  Filled 2018-12-10: qty 100

## 2018-12-10 MED ORDER — VITAMIN B-1 100 MG PO TABS
100.0000 mg | ORAL_TABLET | Freq: Every day | ORAL | Status: DC
  Administered 2018-12-10 – 2018-12-11 (×2): 100 mg via ORAL
  Filled 2018-12-10 (×2): qty 1

## 2018-12-10 MED ORDER — ACETAMINOPHEN 650 MG RE SUPP: 650.0000 mg | Freq: Four times a day (QID) | RECTAL | Status: DC | PRN

## 2018-12-10 MED ORDER — UMECLIDINIUM BROMIDE 62.5 MCG/INH IN AEPB
1.0000 | INHALATION_SPRAY | Freq: Every day | RESPIRATORY_TRACT | Status: DC
  Filled 2018-12-10: qty 7

## 2018-12-10 MED ORDER — PNEUMOCOCCAL VAC POLYVALENT 25 MCG/0.5ML IJ INJ
0.5000 mL | INJECTION | INTRAMUSCULAR | Status: AC
  Administered 2018-12-11: 12:00:00 0.5 mL via INTRAMUSCULAR
  Filled 2018-12-10: qty 0.5

## 2018-12-10 MED ORDER — SODIUM CHLORIDE 0.9 % IV SOLN: 250.0000 mL | INTRAVENOUS | Status: DC | PRN

## 2018-12-10 MED ORDER — MOMETASONE FURO-FORMOTEROL FUM 200-5 MCG/ACT IN AERO
2.0000 | INHALATION_SPRAY | Freq: Two times a day (BID) | RESPIRATORY_TRACT | Status: DC
  Administered 2018-12-11: 2 via RESPIRATORY_TRACT
  Filled 2018-12-10 (×2): qty 8.8

## 2018-12-10 MED ORDER — INSULIN ASPART 100 UNIT/ML ~~LOC~~ SOLN
0.0000 [IU] | Freq: Every day | SUBCUTANEOUS | Status: DC
  Administered 2018-12-10: 23:00:00 2 [IU] via SUBCUTANEOUS

## 2018-12-10 MED ORDER — POTASSIUM CHLORIDE IN NACL 20-0.9 MEQ/L-% IV SOLN
INTRAVENOUS | Status: AC
  Administered 2018-12-10: 08:00:00 via INTRAVENOUS
  Filled 2018-12-10: qty 1000

## 2018-12-10 MED ORDER — THIAMINE HCL 100 MG/ML IJ SOLN: 100.0000 mg | Freq: Every day | INTRAMUSCULAR | Status: DC

## 2018-12-10 MED ORDER — PANTOPRAZOLE SODIUM 40 MG PO TBEC
40.0000 mg | DELAYED_RELEASE_TABLET | Freq: Every day | ORAL | Status: DC
  Administered 2018-12-10 – 2018-12-11 (×2): 40 mg via ORAL
  Filled 2018-12-10 (×2): qty 1

## 2018-12-10 MED ORDER — SODIUM CHLORIDE 0.9% FLUSH
3.0000 mL | Freq: Two times a day (BID) | INTRAVENOUS | Status: DC
  Administered 2018-12-10 – 2018-12-11 (×2): 3 mL via INTRAVENOUS

## 2018-12-10 MED ORDER — ALBUTEROL SULFATE (2.5 MG/3ML) 0.083% IN NEBU
2.5000 mg | INHALATION_SOLUTION | RESPIRATORY_TRACT | Status: DC | PRN
  Administered 2018-12-10 – 2018-12-11 (×2): 2.5 mg via RESPIRATORY_TRACT
  Filled 2018-12-10 (×2): qty 3

## 2018-12-10 MED ORDER — FOLIC ACID 1 MG PO TABS
1.0000 mg | ORAL_TABLET | Freq: Every day | ORAL | Status: DC
  Administered 2018-12-10 – 2018-12-11 (×2): 1 mg via ORAL
  Filled 2018-12-10 (×2): qty 1

## 2018-12-10 MED ORDER — LISINOPRIL 10 MG PO TABS: 40.0000 mg | ORAL_TABLET | Freq: Every day | ORAL | Status: DC

## 2018-12-10 MED ORDER — ONDANSETRON HCL 4 MG PO TABS: 4.0000 mg | ORAL_TABLET | Freq: Four times a day (QID) | ORAL | Status: DC

## 2018-12-10 MED ORDER — LISINOPRIL 10 MG PO TABS
40.0000 mg | ORAL_TABLET | Freq: Every day | ORAL | Status: DC
  Administered 2018-12-10 – 2018-12-11 (×2): 40 mg via ORAL
  Filled 2018-12-10 (×2): qty 4

## 2018-12-10 MED ORDER — ATORVASTATIN CALCIUM 10 MG PO TABS
10.0000 mg | ORAL_TABLET | Freq: Every day | ORAL | Status: DC
  Administered 2018-12-10: 10 mg via ORAL
  Filled 2018-12-10: qty 1

## 2018-12-10 MED ORDER — TIOTROPIUM BROMIDE MONOHYDRATE 18 MCG IN CAPS: 18.0000 ug | ORAL_CAPSULE | Freq: Every day | RESPIRATORY_TRACT | Status: DC

## 2018-12-10 MED ORDER — SODIUM CHLORIDE 0.9 % IV BOLUS
1000.0000 mL | Freq: Once | INTRAVENOUS | Status: AC
  Administered 2018-12-10: 01:00:00 1000 mL via INTRAVENOUS

## 2018-12-10 MED ORDER — DULAGLUTIDE 1.5 MG/0.5ML ~~LOC~~ SOAJ: 1.5000 mg | SUBCUTANEOUS | Status: DC

## 2018-12-10 MED ORDER — LORAZEPAM 2 MG/ML IJ SOLN: 1.0000 mg | INTRAMUSCULAR | Status: DC | PRN

## 2018-12-10 MED ORDER — GLIMEPIRIDE 2 MG PO TABS
8.0000 mg | ORAL_TABLET | Freq: Every day | ORAL | Status: DC
  Administered 2018-12-11: 09:00:00 8 mg via ORAL
  Filled 2018-12-10 (×2): qty 2
  Filled 2018-12-10: qty 4

## 2018-12-10 MED ORDER — LORAZEPAM 2 MG/ML IJ SOLN: 1.0000 mg | Freq: Four times a day (QID) | INTRAMUSCULAR | Status: DC

## 2018-12-10 MED ORDER — ALBUTEROL SULFATE HFA 108 (90 BASE) MCG/ACT IN AERS: 1.0000 | INHALATION_SPRAY | Freq: Four times a day (QID) | RESPIRATORY_TRACT | Status: DC | PRN

## 2018-12-10 MED ORDER — SODIUM CHLORIDE 0.9% FLUSH: 3.0000 mL | INTRAVENOUS | Status: DC | PRN

## 2018-12-10 MED ORDER — LORAZEPAM 2 MG/ML IJ SOLN: 0.0000 mg | INTRAMUSCULAR | Status: DC

## 2018-12-10 MED ORDER — METFORMIN HCL 500 MG PO TABS: 1000.0000 mg | ORAL_TABLET | Freq: Two times a day (BID) | ORAL | Status: DC

## 2018-12-10 MED ORDER — IOHEXOL 300 MG/ML  SOLN
100.0000 mL | Freq: Once | INTRAMUSCULAR | Status: AC | PRN
  Administered 2018-12-10: 03:00:00 100 mL via INTRAVENOUS

## 2018-12-10 MED ORDER — ACETAMINOPHEN 325 MG PO TABS
650.0000 mg | ORAL_TABLET | Freq: Four times a day (QID) | ORAL | Status: DC | PRN
  Administered 2018-12-10 – 2018-12-11 (×3): 650 mg via ORAL
  Filled 2018-12-10 (×3): qty 2

## 2018-12-10 MED ORDER — LORAZEPAM 2 MG/ML IJ SOLN: 0.0000 mg | Freq: Three times a day (TID) | INTRAMUSCULAR | Status: DC

## 2018-12-10 MED ORDER — ALBUTEROL SULFATE (2.5 MG/3ML) 0.083% IN NEBU
2.5000 mg | INHALATION_SOLUTION | Freq: Three times a day (TID) | RESPIRATORY_TRACT | Status: DC
  Administered 2018-12-10 – 2018-12-11 (×2): 2.5 mg via RESPIRATORY_TRACT
  Filled 2018-12-10 (×3): qty 3

## 2018-12-10 MED ORDER — DICYCLOMINE HCL 10 MG PO CAPS
20.0000 mg | ORAL_CAPSULE | Freq: Two times a day (BID) | ORAL | Status: DC
  Administered 2018-12-10 – 2018-12-11 (×2): 20 mg via ORAL
  Filled 2018-12-10 (×2): qty 2

## 2018-12-10 MED ORDER — ENOXAPARIN SODIUM 60 MG/0.6ML ~~LOC~~ SOLN
60.0000 mg | SUBCUTANEOUS | Status: DC
  Administered 2018-12-10 – 2018-12-11 (×2): 60 mg via SUBCUTANEOUS
  Filled 2018-12-10 (×2): qty 0.6

## 2018-12-10 MED ORDER — LORAZEPAM 1 MG PO TABS: 1.0000 mg | ORAL_TABLET | ORAL | Status: DC | PRN

## 2018-12-10 MED ORDER — ADULT MULTIVITAMIN W/MINERALS CH
1.0000 | ORAL_TABLET | Freq: Every day | ORAL | Status: DC
  Administered 2018-12-10 – 2018-12-11 (×2): 1 via ORAL
  Filled 2018-12-10 (×2): qty 1

## 2018-12-10 MED ORDER — ONDANSETRON HCL 4 MG/2ML IJ SOLN
4.0000 mg | Freq: Four times a day (QID) | INTRAMUSCULAR | Status: DC | PRN
  Administered 2018-12-10: 09:00:00 4 mg via INTRAVENOUS
  Filled 2018-12-10: qty 2

## 2018-12-10 MED ORDER — HYDROCODONE-ACETAMINOPHEN 5-325 MG PO TABS
1.0000 | ORAL_TABLET | Freq: Once | ORAL | Status: AC
  Administered 2018-12-10: 06:00:00 1 via ORAL
  Filled 2018-12-10: qty 1

## 2018-12-10 NOTE — ED Notes (Signed)
Called and updated pts girlfriend, Margaretha Sheffield.

## 2018-12-10 NOTE — ED Notes (Signed)
Unable to ambulate patient at this time. Patient asleep.

## 2018-12-10 NOTE — ED Notes (Signed)
4 liters of oxygen placed on the patient due to decreased oxygen saturations. Patient is suppose to be on C-PAP at home.

## 2018-12-10 NOTE — ED Notes (Signed)
Date and time results received: 12/10/18 0356   Test: pCO2 Critical Value: 70.7  Name of Provider Notified: Rancour, MD

## 2018-12-10 NOTE — H&P (Addendum)
TRH H&P    Patient Demographics:    Carr Shartzer, is a 48 y.o. male  MRN: [id number]  DOB - 1970/07/25  Admit Date - 12/09/2018  Referring MD/NP/PA:  Rancour  Outpatient Primary MD for the patient is Patient, No Pcp Per  Patient coming from: [place]  Chief complaint-  seizure   HPI:    Yashar Inclan  is a 48 y.o. male,  w hypertension, hyperlipidemia, dm2, gerd, Copd, OSA not on cpap, apparently presents with seizure,  While sitting outside at [employer] bar.  Pt is uncertain of duration.  Pt states witnesses called 911, and was brought to ED.   In ED,  T 98.5, P 94  R 20 Bp 105/59  Pox 92% on [place] W[place] 123.8kg  CT brain IMPRESSION: Somewhat limited due to patient motion. No acute intracranial abnormality.  CT abd/ pelvis IMPRESSION: 1. A 3 mm nonobstructing right renal inferior pole calculus. Slight heterogeneous and edematous appearance of the right kidney noted. Correlation with urinalysis recommended to exclude pyelonephritis. No drainable fluid collection/abscess. 2. No bowel obstruction or active inflammation. Normal appendix. 3. Fatty liver.  CXR IMPRESSION: No active disease.   Wbc 9.2, Hgb 13.5, Plt 399 Na 137, K 3.1, Bun 6, Creatinine 0.61 Lipase 47 UDS negative covid -19 negative  ETOH 137  Trop 6  Ekg nsr at 95, nl axis, nl int, no st-t changes c/w ischemia  Pt given keppra 1gm iv x1 in ED  Pt will be admitted for seizure. Acute hypoxic/ hypercapneic respiratory failure secondary to seizure/ OSA     Review of systems:    In addition to the HPI above,  No Fever-chills, No Headache, No changes with Vision or hearing, No problems swallowing food or Liquids, No Chest pain, Cough or Shortness of Breath, No Abdominal pain, No Nausea or Vomiting, bowel movements are regular, No Blood in stool or Urine, No dysuria, No new skin rashes or bruises, No new joints pains-aches,   No new weakness, tingling, numbness in any extremity, No recent weight gain or loss, No polyuria, polydypsia or polyphagia, No significant Mental Stressors.  All other systems reviewed and are negative.    Past History of the following :    Past Medical History:  Diagnosis Date   Asthma    COPD (chronic obstructive pulmonary disease) (HCC)    Diabetes mellitus without complication (HCC)    Hypertension       History reviewed. No pertinent surgical history.    Social History:      Social History   Tobacco Use   Smoking status: Current Some Day Smoker   Smokeless tobacco: Never Used  Substance Use Topics   Alcohol use: Yes    Alcohol/week: 6.0 standard drinks    Types: 6 Cans of beer per week    Comment: a week       Family History :    No family history on file. Pt is too somnolent to recall   Home Medications:   Prior to Admission medications   Medication Sig Start  Date End Date Taking? Authorizing Provider  acetaminophen (TYLENOL) 325 MG tablet Take 650 mg by mouth every 6 (six) hours as needed.    [provider]  albuterol (VENTOLIN HFA) 108 (90 Base) MCG/ACT inhaler Inhale 1-2 puffs into the lungs every 6 (six) hours as needed for wheezing or shortness of breath.    [provider]  atorvastatin (LIPITOR) 10 MG tablet Take 10 mg by mouth daily.    [provider]  dicyclomine (BENTYL) 20 MG tablet Take 1 tablet (20 mg total) by mouth 2 (two) times daily. 12/02/18   Fondaw, Rodrigo Ran, PA  Dulaglutide (TRULICITY) 1.5 MG/0.5ML SOPN Inject 1.5 mg into the skin once a week.     [provider]  glimepiride (AMARYL) 4 MG tablet Take 8 mg by mouth daily with breakfast.     [provider]  lisinopril (ZESTRIL) 40 MG tablet Take 40 mg by mouth daily.    [provider]  metFORMIN (GLUCOPHAGE) 1000 MG tablet Take 1,000 mg by mouth 2 (two) times daily with a meal.    [provider]    mometasone-formoterol (DULERA) 200-5 MCG/ACT AERO Inhale 2 puffs into the lungs 2 (two) times daily.    [provider]  omeprazole (PRILOSEC) 40 MG capsule Take 40 mg by mouth daily.    [provider]  ondansetron (ZOFRAN) 4 MG tablet Take 1 tablet (4 mg total) by mouth every 6 (six) hours. 12/02/18   Gailen Shelter, PA  tiotropium (SPIRIVA) 18 MCG inhalation capsule Place 18 mcg into inhaler and inhale daily.    [provider]     Allergies:     Allergies  Allergen Reactions   Banana     "swelling" of the skin     Physical Exam:   Vitals  Blood pressure 117/82, pulse 94, temperature 98.5 F (36.9 C), temperature source Rectal, resp. rate 17, weight 123.8 kg, SpO2 99 %.  1.  General: somnolent  2. Psychiatric: euthymic  3. Neurologic: cn2-12 intact, reflexes 2+ symmetric, diffuse with no clonus, motor 5/5 in all 4 ext  4. HEENMT:  Anicteric, pupils 1.40mm symmetric, direct, consensual, near intact Neck: no jvd  5. Respiratory : CTAB  6. Cardiovascular : rrr s1, s2,   7. Gastrointestinal:  Abd: soft, obese, nt, nd, +bs  8. Skin:  Ext: no c/c/e,  No rash  9.Musculoskeletal:  Good ROM     Data Review:    CBC Recent Labs  Lab 12/10/18 0001  WBC 9.2  HGB 13.5  HCT 41.6  PLT 399  MCV 90.8  MCH 29.5  MCHC 32.5  RDW 12.2  LYMPHSABS 2.5  MONOABS 0.7  EOSABS 0.4  BASOSABS 0.1   ------------------------------------------------------------------------------------------------------------------  Results for orders placed or performed during the hospital encounter of 12/09/18 (from the past 48 hour(s))  Urinalysis, Routine w reflex microscopic     Status: Abnormal   Collection Time: 12/09/18 11:30 PM  Result Value Ref Range   Color, Urine COLORLESS (A) YELLOW   APPearance CLEAR CLEAR   Specific Gravity, Urine 1.002 (L) 1.005 - 1.030   pH 7.0 5.0 - 8.0   Glucose, UA 150 (A) NEGATIVE mg/dL   Hgb urine dipstick NEGATIVE  NEGATIVE   Bilirubin Urine NEGATIVE NEGATIVE   Ketones, ur NEGATIVE NEGATIVE mg/dL   Protein, ur NEGATIVE NEGATIVE mg/dL   Nitrite NEGATIVE NEGATIVE   Leukocytes,Ua NEGATIVE NEGATIVE    Comment: Performed at [facility], [address] [place], [place]  [address]  CBC with Differential/Platelet     Status: Abnormal   Collection Time: 12/10/18 12:01 AM  Result Value Ref Range   WBC 9.2 4.0 - 10.5 K/uL   RBC 4.58 4.22 - 5.81 MIL/uL   Hemoglobin 13.5 13.0 - 17.0 g/dL   HCT 41.6 39.0 - 52.0 %   MCV 90.8 80.0 - 100.0 fL   MCH 29.5 26.0 - 34.0 pg   MCHC 32.5 30.0 - 36.0 g/dL   RDW 12.2 11.5 - 15.5 %   Platelets 399 150 - 400 K/uL   nRBC 0.0 0.0 - 0.2 %   Neutrophils Relative % 56 %   Neutro Abs 5.2 1.7 - 7.7 K/uL   Lymphocytes Relative 27 %   Lymphs Abs 2.5 0.7 - 4.0 K/uL   Monocytes Relative 8 %   Monocytes Absolute 0.7 0.1 - 1.0 K/uL   Eosinophils Relative 4 %   Eosinophils Absolute 0.4 0.0 - 0.5 K/uL   Basophils Relative 1 %   Basophils Absolute 0.1 0.0 - 0.1 K/uL   Immature Granulocytes 4 %   Abs Immature Granulocytes 0.34 (H) 0.00 - 0.07 K/uL    Comment: Performed at [facility], [address] [place], [place] [address]  Comprehensive metabolic panel     Status: Abnormal   Collection Time: 12/10/18 12:01 AM  Result Value Ref Range   Sodium 137 135 - 145 mmol/L   Potassium 3.1 (L) 3.5 - 5.1 mmol/L   Chloride 100 98 - 111 mmol/L   CO2 26 22 - 32 mmol/L   Glucose, Bld 336 (H) 70 - 99 mg/dL   BUN 6 6 - 20 mg/dL   Creatinine, Ser 0.61 0.61 - 1.24 mg/dL   Calcium 9.4 8.9 - 10.3 mg/dL   Total Protein 6.6 6.5 - 8.1 g/dL   Albumin 3.6 3.5 - 5.0 g/dL   AST 14 (L) 15 - 41 U/L   ALT 23 0 - 44 U/L   Alkaline Phosphatase 74 38 - 126 U/L   Total Bilirubin 0.5 0.3 - 1.2 mg/dL   GFR calc non Af Amer >60 >60 mL/min   GFR calc Af Amer >60 >60 mL/min   Anion gap 11 5 - 15    Comment: Performed at [facility], [address], [place], [place] [address]  Lipase, blood     Status:  None   Collection Time: 12/10/18 12:01 AM  Result Value Ref Range   Lipase 47 11 - 51 U/L    Comment: Performed at [facility], [address] [place], [place] [address]  Troponin I (High Sensitivity)     Status: None   Collection Time: 12/10/18 12:01 AM  Result Value Ref Range   Troponin I (High Sensitivity) 6 <18 ng/L    Comment: (NOTE) Elevated high sensitivity troponin I (hsTnI) values and significant  changes across serial measurements may suggest ACS but many other  chronic and acute conditions are known to elevate hsTnI results.  Refer to the "Links" section for chest pain algorithms and additional  guidance. Performed at [facility], [address] [place], [place] [address]   Ethanol     Status: Abnormal   Collection Time: 12/10/18 12:02 AM  Result Value Ref Range   Alcohol, Ethyl (B) 137 (H) <10 mg/dL    Comment: (NOTE) Lowest detectable limit for serum alcohol is 10 mg/dL. For medical purposes only. Performed at [facility], [address], [place], [place] [address]   Rapid urine drug screen (hospital performed)  Status: None   Collection Time: 12/10/18  1:22 AM  Result Value Ref Range   Opiates NONE DETECTED NONE DETECTED   Cocaine NONE DETECTED NONE DETECTED   Benzodiazepines NONE DETECTED NONE DETECTED   Amphetamines NONE DETECTED NONE DETECTED   Tetrahydrocannabinol NONE DETECTED NONE DETECTED   Barbiturates NONE DETECTED NONE DETECTED    Comment: (NOTE) DRUG SCREEN FOR MEDICAL PURPOSES ONLY.  IF CONFIRMATION IS NEEDED FOR ANY PURPOSE, NOTIFY LAB WITHIN 5 DAYS. LOWEST DETECTABLE LIMITS FOR URINE DRUG SCREEN Drug Class                     Cutoff (ng/mL) Amphetamine and metabolites    1000 Barbiturate and metabolites    200 Benzodiazepine                 200 Tricyclics and metabolites     300 Opiates and metabolites        300 Cocaine and metabolites        300 THC                            50 Performed at [facility], [address],  [place], [place] [address]   Troponin I (High Sensitivity)     Status: None   Collection Time: 12/10/18  1:34 AM  Result Value Ref Range   Troponin I (High Sensitivity) 7 <18 ng/L    Comment: (NOTE) Elevated high sensitivity troponin I (hsTnI) values and significant  changes across serial measurements may suggest ACS but many other  chronic and acute conditions are known to elevate hsTnI results.  Refer to the "Links" section for chest pain algorithms and additional  guidance. Performed at [facility], [address], [place], [place] [address]   Blood gas, arterial (at [facility])     Status: Abnormal   Collection Time: 12/10/18  3:45 AM  Result Value Ref Range   FIO2 32.00    pH, Arterial 7.214 (L) 7.350 - 7.450   pCO2 arterial 70.7 (HH) 32.0 - 48.0 mmHg    Comment: CRITICAL RESULT CALLED TO, READ BACK BY AND VERIFIED WITH: SAPPELT,J @ 0356 ON 12/10/18 BY JUW    pO2, Arterial 85.7 83.0 - 108.0 mmHg   Bicarbonate 22.9 20.0 - 28.0 mmol/L   Acid-Base Excess 0.5 0.0 - 2.0 mmol/L   O2 Saturation 92.8 %   Patient temperature 37.0    Allens test (pass/fail) PASS PASS    Comment: Performed at [facility], [address], [place], [place] [address]    Chemistries  Recent Labs  Lab 12/10/18 0001  NA 137  K 3.1*  CL 100  CO2 26  GLUCOSE 336*  BUN 6  CREATININE 0.61  CALCIUM 9.4  AST 14*  ALT 23  ALKPHOS 74  BILITOT 0.5   ------------------------------------------------------------------------------------------------------------------  ------------------------------------------------------------------------------------------------------------------ GFR: Estimated Creatinine Clearance: 150 mL/min (by C-G formula based on SCr of 0.61 mg/dL). Liver Function Tests: Recent Labs  Lab 12/10/18 0001  AST 14*  ALT 23  ALKPHOS 74  BILITOT 0.5  PROT 6.6  ALBUMIN 3.6   Recent Labs  Lab 12/10/18 0001  LIPASE 47   No results for input(s): AMMONIA in the last 168  hours. Coagulation Profile: No results for input(s): INR, PROTIME in the last 168 hours. Cardiac Enzymes: No results for input(s): CKTOTAL, CKMB, CKMBINDEX, TROPONINI in the last 168 hours. BNP (last 3 results) No results for input(s): PROBNP in  the last 8760 hours. HbA1C: No results for input(s): HGBA1C in the last 72 hours. CBG: No results for input(s): GLUCAP in the last 168 hours. Lipid Profile: No results for input(s): CHOL, HDL, LDLCALC, TRIG, CHOLHDL, LDLDIRECT in the last 72 hours. Thyroid Function Tests: No results for input(s): TSH, T4TOTAL, FREET4, T3FREE, THYROIDAB in the last 72 hours. Anemia Panel: No results for input(s): VITAMINB12, FOLATE, FERRITIN, TIBC, IRON, RETICCTPCT in the last 72 hours.  --------------------------------------------------------------------------------------------------------------- Urine analysis:    Component Value Date/Time   COLORURINE COLORLESS (A) 12/09/2018 2330   APPEARANCEUR CLEAR 12/09/2018 2330   LABSPEC 1.002 (L) 12/09/2018 2330   PHURINE 7.0 12/09/2018 2330   GLUCOSEU 150 (A) 12/09/2018 2330   HGBUR NEGATIVE 12/09/2018 2330   BILIRUBINUR NEGATIVE 12/09/2018 2330   KETONESUR NEGATIVE 12/09/2018 2330   PROTEINUR NEGATIVE 12/09/2018 2330   NITRITE NEGATIVE 12/09/2018 2330   LEUKOCYTESUR NEGATIVE 12/09/2018 2330      Imaging Results:    Ct Head Wo Contrast  Result Date: 12/10/2018 CLINICAL DATA:  Encephalopathy EXAM: CT HEAD WITHOUT CONTRAST TECHNIQUE: Contiguous axial images were obtained from the base of the skull through the vertex without intravenous contrast. COMPARISON:  None. FINDINGS: Limited due to patient motion. Brain: No evidence of acute territorial infarction, hemorrhage, hydrocephalus,extra-axial collection or mass lesion/mass effect. Normal gray-white differentiation. Ventricles are normal in size and contour. Vascular: No hyperdense vessel or unexpected calcification. Skull: The skull is intact. No fracture or  focal lesion identified. Sinuses/Orbits: The visualized paranasal sinuses and mastoid air cells are clear. The orbits and globes intact. Other: None IMPRESSION: Somewhat limited due to patient motion. No acute intracranial abnormality. Electronically Signed   By: Jonna Clark M.D.   On: 12/10/2018 02:47   Ct Abdomen Pelvis W Contrast  Result Date: 12/10/2018 CLINICAL DATA:  48 year old male with altered mental status abdominal pain. EXAM: CT ABDOMEN AND PELVIS WITH CONTRAST TECHNIQUE: Multidetector CT imaging of the abdomen and pelvis was performed using the standard protocol following bolus administration of intravenous contrast. CONTRAST:  OMNIPAQUE IOHEXOL 300 MG/ML  SOLN COMPARISON:  CT of the abdomen pelvis dated 12/02/2018 FINDINGS: Lower chest: The visualized lung bases are clear. Punctate left lung base calcified granuloma. No intra-abdominal free air or free fluid. Hepatobiliary: Apparent fatty infiltration of the liver. No intrahepatic biliary ductal dilatation. The gallbladder is unremarkable. Pancreas: Unremarkable. No pancreatic ductal dilatation or surrounding inflammatory changes. Spleen: Normal in size without focal abnormality. Adrenals/Urinary Tract: The adrenal glands are unremarkable. There is a 3 mm nonobstructing right renal inferior pole calculus. No hydronephrosis. Slight heterogeneous and edematous appearance of the right kidney. Correlation with urinalysis recommended to exclude pyelonephritis. The left kidney is unremarkable. The visualized ureters appear unremarkable. The urinary bladder is minimally distended and grossly unremarkable. Stomach/Bowel: There is no bowel obstruction or active inflammation. The appendix is normal. Vascular/Lymphatic: The abdominal aorta and IVC are grossly unremarkable. No portal venous gas. There is no adenopathy. Reproductive: The prostate and seminal vesicles are grossly unremarkable. No pelvic mass. Other: Small fat containing umbilical hernia.  Musculoskeletal: Disc desiccation and vacuum phenomena at L5-S1. No acute osseous pathology. IMPRESSION: 1. A 3 mm nonobstructing right renal inferior pole calculus. Slight heterogeneous and edematous appearance of the right kidney noted. Correlation with urinalysis recommended to exclude pyelonephritis. No drainable fluid collection/abscess. 2. No bowel obstruction or active inflammation. Normal appendix. 3. Fatty liver. Electronically Signed   By: Elgie Collard M.D.   On: 12/10/2018 02:51   Dg Chest Portable 1 View  Result Date: 12/10/2018 CLINICAL DATA:  48 year old male with altered mental status. EXAM: PORTABLE CHEST 1 VIEW COMPARISON:  None. FINDINGS: The heart size and mediastinal contours are within normal limits. Both lungs are clear. The visualized skeletal structures are unremarkable. IMPRESSION: No active disease. Electronically Signed   By: Elgie CollardArash  Radparvar M.D.   On: 12/10/2018 02:45       Assessment & Plan:    Principal Problem:   Acute respiratory failure with hypoxia and hypercapnia (HCC) Active Problems:   Seizure (HCC)  Seizure MRI brain EEG Likely etoh related , will monitor Ativan 1mg  iv q6h   ETOH abuse CIWA  Acute respiratory failure with hypoxia/ hypercapnea secondary to OSA Bipap if covid -19 negative  Dm2 Cont metformin  1000mg  po bid Cont Amaryl 4mg  po qday Cont Trulicity   Hyperlipidemia Cont Lipitor 10mg  po qday  Hypertension Cont Lisinopril 40mg  po qday  Gerd Cont PPI  Copd/ Asthma Cont Spiriva 1puff qday Cont Dulera 2puffs bid Cont Albuterol HFA 2puff q6h prn     DVT Prophylaxis-   Lovenox - SCDs   AM Labs Ordered, also please review Full Orders  Family Communication: Admission, patients condition and plan of care including tests being ordered have been discussed with the patient  who indicate understanding and agree with the plan and Code Status.  Code Status:  FULL CODE per patient  Admission status:   Inpatient: Based on  patients clinical presentation and evaluation of above clinical data, I have made determination that patient meets Inpatient criteria at this time.  Pt will require evaluation of seizure and has high risk of etoh withdrawal. Pt will require CIWA  Pt has high risk of clinical deterioration,  Pt will require > 2 nites stay.   Time spent in minutes : 70 minutes   Pearson GrippeJames Lucille Witts M.D on 12/10/2018 at 4:44 AM

## 2018-12-10 NOTE — ED Notes (Addendum)
Date and time results received: 12/10/18   Test: PCO2 Critical Value:70.7  Name of Provider Notified: Rancour  Orders Received? Or Actions Taken?: Physician notified.

## 2018-12-10 NOTE — Progress Notes (Signed)
  Patient seen and evaluated, chart reviewed, please see EMR for updated orders. Please see full H&P dictated by admitting physician Dr. Maudie Mercury for same date of service.      1)Syncope Versus Seizure--- BAL in the ED was 137 -Suspect syncope secondary to alcohol intoxication.  Clinically seizures less likely -EEG pending  2)Etoh Abuse- BAL in the ED was 137 lorazepam per CIWA protocol, folic acid and thiamine as ordered  3)Diarrhea-query infectious Versus pancreas related and the patient with history of alcohol abuse--- stool for C. difficile and GI pathogen pending  4)FEN--continue IV fluids with potassium supplementation given hypokalemia and GI losses  5)DM2- -hold metformin due to diarrhea, continue insulin regimen  5) morbid obesity with hypercapnic respiratory failure--CPAP nightly -Suspect OSA, patient will need outpatient sleep study  6)HTN-hold lisinopril in the setting of GI losses with risk for dehydration,  7)COPD/Tobacco Abuse-stable, smoking cessation advised, continue bronchodilators

## 2018-12-10 NOTE — ED Notes (Signed)
Patient was alert initially when he cam in by EMS and responsive. Over the last 2 hours patient has been asleep and hard to arouse/unarousable.

## 2018-12-10 NOTE — ED Notes (Signed)
Patient transported to CT 

## 2018-12-10 NOTE — ED Notes (Signed)
PTs  (SO) called to get update on Pt. Johnathan Smith [phone]

## 2018-12-11 ENCOUNTER — Observation Stay (HOSPITAL_BASED_OUTPATIENT_CLINIC_OR_DEPARTMENT_OTHER): Payer: Self-pay

## 2018-12-11 DIAGNOSIS — R55 Syncope and collapse: Secondary | ICD-10-CM

## 2018-12-11 LAB — CBC
HCT: 42.6 % (ref 39.0–52.0)
Hemoglobin: 13.4 g/dL (ref 13.0–17.0)
MCH: 29.1 pg (ref 26.0–34.0)
MCHC: 31.5 g/dL (ref 30.0–36.0)
MCV: 92.4 fL (ref 80.0–100.0)
Platelets: 373 10*3/uL (ref 150–400)
RBC: 4.61 MIL/uL (ref 4.22–5.81)
RDW: 12.5 % (ref 11.5–15.5)
WBC: 9.9 10*3/uL (ref 4.0–10.5)
nRBC: 0 % (ref 0.0–0.2)

## 2018-12-11 LAB — COMPREHENSIVE METABOLIC PANEL
ALT: 26 U/L (ref 0–44)
AST: 18 U/L (ref 15–41)
Albumin: 3.4 g/dL — ABNORMAL LOW (ref 3.5–5.0)
Alkaline Phosphatase: 75 U/L (ref 38–126)
Anion gap: 9 (ref 5–15)
BUN: 13 mg/dL (ref 6–20)
CO2: 26 mmol/L (ref 22–32)
Calcium: 8.5 mg/dL — ABNORMAL LOW (ref 8.9–10.3)
Chloride: 102 mmol/L (ref 98–111)
Creatinine, Ser: 0.62 mg/dL (ref 0.61–1.24)
GFR calc Af Amer: >60 mL/min (ref >60)
GFR calc non Af Amer: >60 mL/min (ref >60)
Glucose, Bld: 278 mg/dL — ABNORMAL HIGH (ref 70–99)
Potassium: 3.9 mmol/L (ref 3.5–5.1)
Sodium: 137 mmol/L (ref 135–145)
Total Bilirubin: 0.4 mg/dL (ref 0.3–1.2)
Total Protein: 6.3 g/dL — ABNORMAL LOW (ref 6.5–8.1)

## 2018-12-11 LAB — ECHOCARDIOGRAM COMPLETE
Height: 69 in
Weight: 4751.35 oz

## 2018-12-11 LAB — GLUCOSE, CAPILLARY
Glucose-Capillary: 263 mg/dL — ABNORMAL HIGH (ref 70–99)
Glucose-Capillary: 238 mg/dL — ABNORMAL HIGH (ref 70–99)

## 2018-12-11 LAB — HIV ANTIBODY (ROUTINE TESTING W REFLEX): HIV Screen 4th Generation wRfx: NONREACTIVE

## 2018-12-11 MED ORDER — ATORVASTATIN CALCIUM 10 MG PO TABS: 10.0000 mg | ORAL_TABLET | Freq: Every day | ORAL | 1 refills | Status: DC

## 2018-12-11 MED ORDER — DICYCLOMINE HCL 20 MG PO TABS: 20.0000 mg | ORAL_TABLET | Freq: Two times a day (BID) | ORAL | 0 refills | Status: DC | PRN

## 2018-12-11 MED ORDER — FOLIC ACID 1 MG PO TABS: 1.0000 mg | ORAL_TABLET | Freq: Every day | ORAL | 3 refills | Status: DC

## 2018-12-11 MED ORDER — ALBUTEROL SULFATE (2.5 MG/3ML) 0.083% IN NEBU: 2.5000 mg | INHALATION_SOLUTION | RESPIRATORY_TRACT | Status: DC | PRN

## 2018-12-11 MED ORDER — ONDANSETRON HCL 4 MG PO TABS: 4.0000 mg | ORAL_TABLET | Freq: Three times a day (TID) | ORAL | 2 refills | Status: DC | PRN

## 2018-12-11 MED ORDER — LISINOPRIL 40 MG PO TABS: 40.0000 mg | ORAL_TABLET | Freq: Every day | ORAL | 1 refills | Status: DC

## 2018-12-11 MED ORDER — INFLUENZA VAC SPLIT QUAD 0.5 ML IM SUSY
0.5000 mL | PREFILLED_SYRINGE | INTRAMUSCULAR | Status: AC
  Administered 2018-12-11: 0.5 mL via INTRAMUSCULAR
  Filled 2018-12-11: qty 0.5

## 2018-12-11 MED ORDER — VITAMIN B-1 100 MG PO TABS: 100.0000 mg | ORAL_TABLET | Freq: Every day | ORAL | 2 refills | Status: DC

## 2018-12-11 NOTE — Discharge Summary (Signed)
Johnathan Smith, is a 48 y.o. male  DOB [birth year]  MRN [id number].  Admission date:  12/09/2018  Admitting Physician  Shon Hale, MD  Discharge Date:  12/11/2018   Primary MD  Patient, No Pcp Per  Recommendations for primary care physician for things to follow:   1) abstinence from alcohol strongly advised-please consider alcohol rehab and/or AA meetings 2) abstinence from tobacco advised--- you may use over-the-counter nicotine patch to help you quit smoking 3) you need to follow-up with a primary care physician in order to get a sleep study scheduled as an outpatient so he can potentially get a CPAP machine for your presumed obstructive sleep apnea 4) you also need to follow-up with the primary care physician on an ongoing basis for chronic management of your hypertension, diabetes, obesity, cholesterol problems and other medical issues including obstructive sleep apnea 5) given your episodes of dizziness/passing out spells-you will need outpatient echocardiogram and carotid artery Dopplers as well as possible EEG test to be done by your primary care physician as outpatient 6)Per [facility] statutes, patients with seizures are not allowed to drive until they have been seizure-free for six months.   Use caution when using heavy equipment or power tools. Avoid working on ladders or at heights. Take showers instead of baths, Do not lock yourself in a room alone (i.e. bathroom).Ensure the water temperature is not too high on the home water heater. Do not go swimming alone. When caring for infants or small children, sit down when holding, feeding, or changing them to minimize risk of injury to the child in the event you have a seizure.   Do not lock yourself in a room alone (i.e. bathroom).  Maintain good sleep hygiene. Avoid alcohol.   If patienthas another seizure, call 911 and bring them back to the  ED if: A. The seizure lasts longer than 5 minutes.  B. The patient doesn't wake shortly after the seizure or has new problems such as difficulty seeing, speaking or moving following the seizure C. The patient was injured during the seizure D. The patient has a temperature over 102 F (39C) E. The patient vomited during the seizure and now is having trouble breathing  Admission Diagnosis  Seizure-like activity (HCC) [R56.9] Alcoholic intoxication without complication (HCC) [F10.920] Acute on chronic respiratory failure with hypercapnia (HCC) [J96.22] Respiratory failure with hypoxia (HCC) [J96.91]   Discharge Diagnosis  Seizure-like activity (HCC) [R56.9] Alcoholic intoxication without complication (HCC) [F10.920] Acute on chronic respiratory failure with hypercapnia (HCC) [J96.22] Respiratory failure with hypoxia (HCC) [J96.91]    Principal Problem:   Acute respiratory failure with hypoxia and hypercapnia (HCC) Active Problems:   Seizure (HCC)   Alcohol intoxication (HCC)   OSA (obstructive sleep apnea)   Respiratory failure with hypoxia (HCC)      Past Medical History:  Diagnosis Date   Asthma    COPD (chronic obstructive pulmonary disease) (HCC)    Diabetes mellitus without complication (HCC)    Hypertension    OSA (obstructive sleep  apnea)    Seizure (HCC)    History reviewed   HPI  from the history and physical done on the day of admission:    - Johnathan Smith  is a 48 y.o. male,  w hypertension, hyperlipidemia, dm2, gerd, Copd, OSA not on cpap, apparently presents with seizure,  While sitting outside at [place] bar.  Pt is uncertain of duration.  Pt states witnesses called 911, and was brought to ED.   In ED,  T 98.5, P 94  R 20 Bp 105/59  Pox 92% on [place] Wt 123.8kg  CT brain IMPRESSION: Somewhat limited due to patient motion. No acute intracranial abnormality.  CT abd/ pelvis IMPRESSION: 1. A 3 mm nonobstructing right renal inferior pole  calculus. Slight heterogeneous and edematous appearance of the right kidney noted. Correlation with urinalysis recommended to exclude pyelonephritis. No drainable fluid collection/abscess. 2. No bowel obstruction or active inflammation. Normal appendix. 3. Fatty liver.  CXR IMPRESSION: No active disease.   Wbc 9.2, Hgb 13.5, Plt 399 Na 137, K 3.1, Bun 6, Creatinine 0.61 Lipase 47 UDS negative covid -19 negative  ETOH 137  Trop 6  Ekg nsr at 95, nl axis, nl int, no st-t changes c/w ischemia  Pt given keppra 1gm iv x1 in ED  Pt will be admitted for seizure. Acute hypoxic/ hypercapneic respiratory failure secondary to seizure/ OSA     Hospital Course:   Brief summary 48 year old morbidly obese male with past medical history relevant for ongoing alcohol abuse, tobacco abuse, diabetes, hypertension morbid obesity and presumed OSA admitted on 12/10/2018 after passing out while drunk -Patient initially was reluctant to stay in the hospital and wanted to leave AMA when I saw him in the ED on 12/10/2018 -Reluctantly agreed to stay overnight for telemetry monitoring  - A/p 1)Syncope Versus Seizure--- BAL in the ED was 137 -Suspect syncope secondary to alcohol intoxication.  Clinically seizures less likely Patient initially was reluctant to stay in the hospital and wanted to leave AMA when I saw him in the ED on 12/10/2018 -Reluctantly agreed to stay overnight for telemetry monitoring -Today 12/11/2018 patient requesting discharge home and threatening to leave AMA if not discharged home -He declined MRI brain, declines echocardiogram and carotid artery Dopplers, declines EEG -He strongly advised to follow-up as outpatient and establish care with a primary care provider who can get this test done as outpatient  2)Etoh Abuse- BAL in the ED was 137 We used lorazepam per [facility]WA protocol, =-No evidence of frank delirium tremens at this time, okay to discharge home on folic  acid and thiamine as ordered  3)Diarrhea-query infectious Versus pancreas related and the patient with history of alcohol abuse--- diarrhea resolved, unable to obtain stool for C. difficile and GI pathogen  -Patient without fevers, no abdominal pain no vomiting  4)FEN/hypokalemia -patient received IV fluids and we replaced potassium  5)DM2- --okay to resume home regimen  5) morbid obesity with hypercapnic respiratory failure---Suspect OSA, patient will need outpatient sleep study -Lifestyle and dietary modifications discussed  6)HTN-okay to resume lisinopril  7)COPD/Tobacco Abuse-stable, smoking cessation advised,-stable at this time -Oxygen saturation 99% on room air at rest and 97% post ambulation on room air  Discharge Condition: Stable  Follow UP--PCP for further work-up as outpatient including carotid artery Dopplers, echocardiogram and EEG  Diet and Activity recommendation:  As advised  Discharge Instructions    Discharge Instructions    Call MD for:  difficulty breathing, headache or visual disturbances   Complete by: As  directed    Call MD for:  persistant dizziness or light-headedness   Complete by: As directed    Call MD for:  persistant nausea and vomiting   Complete by: As directed    Call MD for:  temperature >100.4   Complete by: As directed    Diet - low sodium heart healthy   Complete by: As directed    Diet Carb Modified   Complete by: As directed    Discharge instructions   Complete by: As directed    1) abstinence from alcohol strongly advised-please consider alcohol rehab and/or AA meetings 2) abstinence from tobacco advised--- you may use over-the-counter nicotine patch to help you quit smoking 3) you need to follow-up with a primary care physician in order to get a sleep study scheduled as an outpatient so he can potentially get a CPAP machine for your presumed obstructive sleep apnea 4) you also need to follow-up with the primary care physician on  an ongoing basis for chronic management of your hypertension, diabetes, obesity, cholesterol problems and other medical issues including obstructive sleep apnea 5) given your episodes of dizziness/passing out spells-you will need outpatient echocardiogram and carotid artery Dopplers as well as possible EEG test to be done by your primary care physician as outpatient 6)Per [facility] statutes, patients with seizures are not allowed to drive until they have been seizure-free for six months.   Use caution when using heavy equipment or power tools. Avoid working on ladders or at heights. Take showers instead of baths, Do not lock yourself in a room alone (i.e. bathroom).Ensure the water temperature is not too high on the home water heater. Do not go swimming alone. When caring for infants or small children, sit down when holding, feeding, or changing them to minimize risk of injury to the child in the event you have a seizure.   Do not lock yourself in a room alone (i.e. bathroom).  Maintain good sleep hygiene. Avoid alcohol.   If patienthas another seizure, call 911 and bring them back to the ED if: A. The seizure lasts longer than 5 minutes.  B. The patient doesn't wake shortly after the seizure or has new problems such as difficulty seeing, speaking or moving following the seizure C. The patient was injured during the seizure D. The patient has a temperature over 102 F (39C) E. The patient vomited during the seizure and now is having trouble breathing   Increase activity slowly   Complete by: As directed         Discharge Medications     Allergies as of 12/11/2018      Reactions   Banana    "swelling" of the skin      Medication List    TAKE these medications   acetaminophen 325 MG tablet Commonly known as: TYLENOL Take 650 mg by mouth every 6 (six) hours as needed.   albuterol 108 (90 Base) MCG/ACT inhaler Commonly known as: VENTOLIN HFA Inhale 1-2 puffs into  the lungs every 6 (six) hours as needed for wheezing or shortness of breath.   atorvastatin 10 MG tablet Commonly known as: LIPITOR Take 1 tablet (10 mg total) by mouth daily.   dicyclomine 20 MG tablet Commonly known as: BENTYL Take 1 tablet (20 mg total) by mouth 2 (two) times daily.   Dulera 200-5 MCG/ACT Aero Generic drug: mometasone-formoterol Inhale 2 puffs into the lungs 2 (two) times daily.   fluticasone 50 MCG/ACT nasal spray Commonly known as:  FLONASE Place 2 sprays into both nostrils daily.   folic acid 1 MG tablet Commonly known as: FOLVITE Take 1 tablet (1 mg total) by mouth daily.   glimepiride 4 MG tablet Commonly known as: AMARYL Take 8 mg by mouth daily with breakfast.   lisinopril 40 MG tablet Commonly known as: ZESTRIL Take 1 tablet (40 mg total) by mouth daily.   metFORMIN 1000 MG tablet Commonly known as: GLUCOPHAGE Take 1,000 mg by mouth 2 (two) times daily with a meal.   omeprazole 40 MG capsule Commonly known as: PRILOSEC Take 40 mg by mouth 2 (two) times daily.   ondansetron 4 MG tablet Commonly known as: ZOFRAN Take 1 tablet (4 mg total) by mouth every 6 (six) hours.   thiamine 100 MG tablet Commonly known as: VITAMIN B-1 Take 1 tablet (100 mg total) by mouth daily.   tiotropium 18 MCG inhalation capsule Commonly known as: SPIRIVA Place 18 mcg into inhaler and inhale daily.   Trulicity 1.5 GU/4.4IH Sopn Generic drug: Dulaglutide Inject 1.5 mg into the skin once a week.       Major procedures and Radiology Reports - PLEASE review detailed and final reports for all details, in brief -   Ct Head Wo Contrast  Result Date: 12/10/2018 CLINICAL DATA:  Encephalopathy EXAM: CT HEAD WITHOUT CONTRAST TECHNIQUE: Contiguous axial images were obtained from the base of the skull through the vertex without intravenous contrast. COMPARISON:  None. FINDINGS: Limited due to patient motion. Brain: No evidence of acute territorial infarction,  hemorrhage, hydrocephalus,extra-axial collection or mass lesion/mass effect. Normal gray-white differentiation. Ventricles are normal in size and contour. Vascular: No hyperdense vessel or unexpected calcification. Skull: The skull is intact. No fracture or focal lesion identified. Sinuses/Orbits: The visualized paranasal sinuses and mastoid air cells are clear. The orbits and globes intact. Other: None IMPRESSION: Somewhat limited due to patient motion. No acute intracranial abnormality. Electronically Signed   By: Prudencio Pair M.D.   On: 12/10/2018 02:47   Ct Abdomen Pelvis W Contrast  Result Date: 12/10/2018 CLINICAL DATA:  48 year old male with altered mental status abdominal pain. EXAM: CT ABDOMEN AND PELVIS WITH CONTRAST TECHNIQUE: Multidetector CT imaging of the abdomen and pelvis was performed using the standard protocol following bolus administration of intravenous contrast. CONTRAST:  123mL OMNIPAQUE IOHEXOL 300 MG/ML  SOLN COMPARISON:  CT of the abdomen pelvis dated 12/02/2018 FINDINGS: Lower chest: The visualized lung bases are clear. Punctate left lung base calcified granuloma. No intra-abdominal free air or free fluid. Hepatobiliary: Apparent fatty infiltration of the liver. No intrahepatic biliary ductal dilatation. The gallbladder is unremarkable. Pancreas: Unremarkable. No pancreatic ductal dilatation or surrounding inflammatory changes. Spleen: Normal in size without focal abnormality. Adrenals/Urinary Tract: The adrenal glands are unremarkable. There is a 3 mm nonobstructing right renal inferior pole calculus. No hydronephrosis. Slight heterogeneous and edematous appearance of the right kidney. Correlation with urinalysis recommended to exclude pyelonephritis. The left kidney is unremarkable. The visualized ureters appear unremarkable. The urinary bladder is minimally distended and grossly unremarkable. Stomach/Bowel: There is no bowel obstruction or active inflammation. The appendix is  normal. Vascular/Lymphatic: The abdominal aorta and IVC are grossly unremarkable. No portal venous gas. There is no adenopathy. Reproductive: The prostate and seminal vesicles are grossly unremarkable. No pelvic mass. Other: Small fat containing umbilical hernia. Musculoskeletal: Disc desiccation and vacuum phenomena at L5-S1. No acute osseous pathology. IMPRESSION: 1. A 3 mm nonobstructing right renal inferior pole calculus. Slight heterogeneous and edematous appearance of the right kidney  noted. Correlation with urinalysis recommended to exclude pyelonephritis. No drainable fluid collection/abscess. 2. No bowel obstruction or active inflammation. Normal appendix. 3. Fatty liver. Electronically Signed   By: Elgie Collard M.D.   On: 12/10/2018 02:51   Ct Abdomen Pelvis W Contrast  Result Date: 12/02/2018 CLINICAL DATA:  Abdominal pain with nausea and vomiting oral contrast was also administered. EXAM: CT ABDOMEN AND PELVIS WITH CONTRAST TECHNIQUE: Multidetector CT imaging of the abdomen and pelvis was performed using the standard protocol following bolus administration of intravenous contrast. CONTRAST:  OMNIPAQUE IOHEXOL 300 MG/ML  SOLN COMPARISON:  None. FINDINGS: Lower chest: There is no lung base edema or consolidation. There is slight lower lobe bronchiectatic change bilaterally. Hepatobiliary: No focal liver lesions are apparent. Gallbladder wall is not appreciably thickened. There is no biliary duct dilatation. Pancreas: There is no pancreatic mass or inflammatory focus. Spleen: No splenic lesions are evident. Adrenals/Urinary Tract: Adrenals bilaterally appear unremarkable. Kidneys bilaterally show no evident mass or hydronephrosis on either side. There is a 2 mm calculus in the lower pole of the right kidney. No ureteral calculi evident on either side. Urinary bladder is midline with wall thickness within normal limits. Stomach/Bowel: Most small bowel loops are fluid filled. There is wall  thickening throughout multiple loops of distal jejunum and ileum. There is no transition zone to suggest bowel obstruction. The terminal ileum appears within normal limits. There are occasional sigmoid diverticula without diverticulitis. There is no free air or portal venous air. Vascular/Lymphatic: There is no abdominal aortic aneurysm. No vascular lesions are evident. There is a circumaortic left renal vein, an anatomic variant. Major venous structures appear patent. There is no adenopathy in the abdomen or pelvis. Reproductive: Prostate and seminal vesicles are normal in size and contour. There are a few tiny prostatic calculi. No evident pelvic mass. Other: The appendix appears normal. There is no abscess or ascites in the abdomen or pelvis. There is a small amount of fat in the umbilicus. Musculoskeletal: There is vacuum phenomenon at L5-S1. No blastic or lytic bone lesions are evident. There is no intramuscular lesions. IMPRESSION: 1. There is wall thickening throughout multiple loops of distal jejunum and ileum with no transition zone to suggest bowel obstruction. This could represent an infectious or inflammatory enteritis, potentially with a degree of ileus. 2. There are occasional sigmoid diverticula without evidence of diverticulitis. 3. 2 mm calculus lower pole right kidney. No hydronephrosis or ureteral calculus on either side. Urinary bladder wall thickness normal. Tiny prostatic calculi evident. 4. Appendix appears normal. Electronically Signed   By: Bretta Bang III M.D.   On: 12/02/2018 13:35   Dg Chest Portable 1 View  Result Date: 12/10/2018 CLINICAL DATA:  48 year old male with altered mental status. EXAM: PORTABLE CHEST 1 VIEW COMPARISON:  None. FINDINGS: The heart size and mediastinal contours are within normal limits. Both lungs are clear. The visualized skeletal structures are unremarkable. IMPRESSION: No active disease. Electronically Signed   By: Elgie Collard M.D.   On:  12/10/2018 02:45    Micro Results   Recent Results (from the past 240 hour(s))  SARS Coronavirus 2 by RT PCR (hospital order, performed in [facility] hospital lab) Nasopharyngeal Nasopharyngeal Swab     Status: None   Collection Time: 12/10/18  4:26 AM   Specimen: Nasopharyngeal Swab  Result Value Ref Range Status   SARS Coronavirus 2 NEGATIVE NEGATIVE Final    Comment: (NOTE) If result is NEGATIVE SARS-CoV-2 target nucleic acids are NOT DETECTED.  The SARS-CoV-2 RNA is generally detectable in upper and lower  respiratory specimens during the acute phase of infection. The lowest  concentration of SARS-CoV-2 viral copies this assay can detect is 250  copies / mL. A negative result does not preclude SARS-CoV-2 infection  and should not be used as the sole basis for treatment or other  patient management decisions.  A negative result may occur with  improper specimen collection / handling, submission of specimen other  than nasopharyngeal swab, presence of viral mutation(s) within the  areas targeted by this assay, and inadequate number of viral copies  (<250 copies / mL). A negative result must be combined with clinical  observations, patient history, and epidemiological information. If result is POSITIVE SARS-CoV-2 target nucleic acids are DETECTED. The SARS-CoV-2 RNA is generally detectable in upper and lower  respiratory specimens dur ing the acute phase of infection.  Positive  results are indicative of active infection with SARS-CoV-2.  Clinical  correlation with patient history and other diagnostic information is  necessary to determine patient infection status.  Positive results do  not rule out bacterial infection or co-infection with other viruses. If result is PRESUMPTIVE POSTIVE SARS-CoV-2 nucleic acids MAY BE PRESENT.   A presumptive positive result was obtained on the submitted specimen  and confirmed on repeat testing.  While 2019 novel coronavirus  (SARS-CoV-2)  nucleic acids may be present in the submitted sample  additional confirmatory testing may be necessary for epidemiological  and / or clinical management purposes  to differentiate between  SARS-CoV-2 and other Sarbecovirus currently known to infect humans.  If clinically indicated additional testing with an alternate test  methodology [phone]([id number]) is advised. The SARS-CoV-2 RNA is generally  detectable in upper and lower respiratory sp ecimens during the acute  phase of infection. The expected result is Negative. Fact Sheet for Patients:  BoilerBrush.com.cyhttps://www.fda.gov/media/136312/download Fact Sheet for Healthcare Providers: https://pope.com/https://www.fda.gov/media/136313/download This test is not yet approved or cleared by the [place] FDA and has been authorized for detection and/or diagnosis of SARS-CoV-2 by FDA under an Emergency Use Authorization (EUA).  This EUA will remain in effect (meaning this test can be used) for the duration of the COVID-19 declaration under Section 564(b)(1) of the Act, 21 U.S.C. section 360bbb-3(b)(1), unless the authorization is terminated or revoked sooner. Performed at [facility], [address] [place], [place] [address]        Today   Subjective    Earleen ReaperStephen Mccard today has no new complaints -        Declines further inpatient work-up, declines echocardiogram, declines carotid artery Dopplers, declines MRI brain, declines EEG,  - -Oxygen saturation 99% on room air at rest and 97% post ambulation on room air  -Requesting discharge home, threatening to leave AMA if not discharged home   Patient has been seen and examined prior to discharge   Objective   Blood pressure (!) 156/98, pulse 78, temperature 97.6 F (36.4 C), temperature source Oral, resp. rate 20, height 5\' 9"  (1.753 m), weight 134.7 kg, SpO2 100 %.   Intake/Output Summary (Last 24 hours) at 12/11/2018 1245 Last data filed at 12/11/2018 0300 Gross per 24 hour  Intake 480 ml  Output --  Net  480 ml    Exam Gen:- Awake Alert, no acute distress , morbidly obese HEENT:- [place].AT, No sclera icterus Neck-Supple Neck,No JVD,.  Lungs-  CTAB , good air movement bilaterally  CV- S1, S2 normal, regular Abd-  +ve B.Sounds, Abd Soft, No tenderness, increased  truncal adiposity    Extremity/Skin:- No  edema,   good pulses Psych-affect is appropriate, oriented x3 Neuro-no new focal deficits, no tremors    Data Review   CBC w Diff:  Lab Results  Component Value Date   WBC 9.9 12/11/2018   HGB 13.4 12/11/2018   HCT 42.6 12/11/2018   PLT 373 12/11/2018   LYMPHOPCT 27 12/10/2018   MONOPCT 8 12/10/2018   EOSPCT 4 12/10/2018   BASOPCT 1 12/10/2018    CMP:  Lab Results  Component Value Date   NA 137 12/11/2018   K 3.9 12/11/2018   CL 102 12/11/2018   CO2 26 12/11/2018   BUN 13 12/11/2018   CREATININE 0.62 12/11/2018   PROT 6.3 (L) 12/11/2018   ALBUMIN 3.4 (L) 12/11/2018   BILITOT 0.4 12/11/2018   ALKPHOS 75 12/11/2018   AST 18 12/11/2018   ALT 26 12/11/2018  .   Total Discharge time is about 33 minutes  Shon Hale M.D on 12/11/2018 at 12:45 PM  Go to [email or web address] -  for contact info  [facility] - Office  [phone]

## 2018-12-11 NOTE — Progress Notes (Signed)
*  PRELIMINARY RESULTS* Echocardiogram 2D Echocardiogram has been performed.  Johnathan Smith 12/11/2018, 1:48 PM

## 2018-12-11 NOTE — Progress Notes (Signed)
SATURATION QUALIFICATIONS: (This note is used to comply with regulatory documentation for home oxygen)  Patient Saturations on Room Air at Rest = 99%  Patient Saturations on Room Air while Ambulating = 97%  Patient Saturations on 0 (zero) Liters of oxygen while Ambulating =  Please briefly explain why patient needs home oxygen:

## 2018-12-11 NOTE — Progress Notes (Signed)
  After further Persuasion -patient reluctantly agreed to stay for echocardiogram to be completed -However he left before the results of the echocardiogram when available -Echo not read yet Roxan Hockey, MD

## 2018-12-11 NOTE — Plan of Care (Signed)

## 2018-12-11 NOTE — Discharge Instructions (Signed)
1) abstinence from alcohol strongly advised-please consider alcohol rehab and/or AA meetings 2) abstinence from tobacco advised--- you may use over-the-counter nicotine patch to help you quit smoking 3) you need to follow-up with a primary care physician in order to get a sleep study scheduled as an outpatient so he can potentially get a CPAP machine for your presumed obstructive sleep apnea 4) you also need to follow-up with the primary care physician on an ongoing basis for chronic management of your hypertension, diabetes, obesity, cholesterol problems and other medical issues including obstructive sleep apnea 5) given your episodes of dizziness/passing out spells-you will need outpatient echocardiogram and carotid artery Dopplers as well as possible EEG test to be done by your primary care physician as outpatient 6)Per [facility] statutes, patients with seizures are not allowed to drive until they have been seizure-free for six months.   Use caution when using heavy equipment or power tools. Avoid working on ladders or at heights. Take showers instead of baths, Do not lock yourself in a room alone (i.e. bathroom).Ensure the water temperature is not too high on the home water heater. Do not go swimming alone. When caring for infants or small children, sit down when holding, feeding, or changing them to minimize risk of injury to the child in the event you have a seizure.   Do not lock yourself in a room alone (i.e. bathroom).  Maintain good sleep hygiene. Avoid alcohol.   If patienthas another seizure, call 911 and bring them back to the ED if: A. The seizure lasts longer than 5 minutes.  B. The patient doesn't wake shortly after the seizure or has new problems such as difficulty seeing, speaking or moving following the seizure C. The patient was injured during the seizure D. The patient has a temperature over 102 F (39C) E. The patient vomited during the seizure and now is  having trouble breathing

## 2018-12-26 ENCOUNTER — Other Ambulatory Visit: Payer: Self-pay

## 2018-12-26 ENCOUNTER — Emergency Department (HOSPITAL_COMMUNITY): Payer: Self-pay

## 2018-12-26 ENCOUNTER — Emergency Department (HOSPITAL_COMMUNITY)
Admission: EM | Admit: 2018-12-26 | Discharge: 2018-12-26 | Disposition: A | Discharge status: Home / Self Care | Payer: Self-pay | Attending: Emergency Medicine | Admitting: Emergency Medicine

## 2018-12-26 ENCOUNTER — Encounter (HOSPITAL_COMMUNITY): Payer: Self-pay | Admitting: Emergency Medicine

## 2018-12-26 DIAGNOSIS — Z7984 Long term (current) use of oral hypoglycemic drugs: Secondary | ICD-10-CM | POA: Insufficient documentation

## 2018-12-26 DIAGNOSIS — R1033 Periumbilical pain: Secondary | ICD-10-CM | POA: Insufficient documentation

## 2018-12-26 DIAGNOSIS — J449 Chronic obstructive pulmonary disease, unspecified: Secondary | ICD-10-CM | POA: Insufficient documentation

## 2018-12-26 DIAGNOSIS — Z79899 Other long term (current) drug therapy: Secondary | ICD-10-CM | POA: Insufficient documentation

## 2018-12-26 DIAGNOSIS — I10 Essential (primary) hypertension: Secondary | ICD-10-CM | POA: Insufficient documentation

## 2018-12-26 DIAGNOSIS — E119 Type 2 diabetes mellitus without complications: Secondary | ICD-10-CM | POA: Insufficient documentation

## 2018-12-26 DIAGNOSIS — F17210 Nicotine dependence, cigarettes, uncomplicated: Secondary | ICD-10-CM | POA: Insufficient documentation

## 2018-12-26 LAB — COMPREHENSIVE METABOLIC PANEL
ALT: 31 U/L (ref 0–44)
AST: 20 U/L (ref 15–41)
Albumin: 3.6 g/dL (ref 3.5–5.0)
Alkaline Phosphatase: 94 U/L (ref 38–126)
Anion gap: 11 (ref 5–15)
BUN: 12 mg/dL (ref 6–20)
CO2: 28 mmol/L (ref 22–32)
Calcium: 8.8 mg/dL — ABNORMAL LOW (ref 8.9–10.3)
Chloride: 98 mmol/L (ref 98–111)
Creatinine, Ser: 0.74 mg/dL (ref 0.61–1.24)
GFR calc Af Amer: >60 mL/min (ref >60)
GFR calc non Af Amer: >60 mL/min (ref >60)
Glucose, Bld: 259 mg/dL — ABNORMAL HIGH (ref 70–99)
Potassium: 3.8 mmol/L (ref 3.5–5.1)
Sodium: 137 mmol/L (ref 135–145)
Total Bilirubin: 0.4 mg/dL (ref 0.3–1.2)
Total Protein: 7.4 g/dL (ref 6.5–8.1)

## 2018-12-26 LAB — URINALYSIS, ROUTINE W REFLEX MICROSCOPIC
Bacteria, UA: NONE SEEN
Bilirubin Urine: NEGATIVE
Glucose, UA: NEGATIVE mg/dL
Hgb urine dipstick: NEGATIVE
Ketones, ur: NEGATIVE mg/dL
Leukocytes,Ua: NEGATIVE
Nitrite: NEGATIVE
Protein, ur: 100 mg/dL — AB
Specific Gravity, Urine: 1.032 — ABNORMAL HIGH (ref 1.005–1.030)
pH: 6 (ref 5.0–8.0)

## 2018-12-26 LAB — CBC
HCT: 46.8 % (ref 39.0–52.0)
Hemoglobin: 14.6 g/dL (ref 13.0–17.0)
MCH: 28.1 pg (ref 26.0–34.0)
MCHC: 31.2 g/dL (ref 30.0–36.0)
MCV: 90 fL (ref 80.0–100.0)
Platelets: 343 10*3/uL (ref 150–400)
RBC: 5.2 MIL/uL (ref 4.22–5.81)
RDW: 12.4 % (ref 11.5–15.5)
WBC: 11.6 10*3/uL — ABNORMAL HIGH (ref 4.0–10.5)
nRBC: 0 % (ref 0.0–0.2)

## 2018-12-26 LAB — LIPASE, BLOOD: Lipase: 22 U/L (ref 11–51)

## 2018-12-26 MED ORDER — HYDROMORPHONE HCL 1 MG/ML IJ SOLN
1.0000 mg | Freq: Once | INTRAMUSCULAR | Status: AC
  Administered 2018-12-26: 1 mg via INTRAVENOUS
  Filled 2018-12-26: qty 1

## 2018-12-26 MED ORDER — DICYCLOMINE HCL 20 MG PO TABS: 20.0000 mg | ORAL_TABLET | Freq: Three times a day (TID) | ORAL | 0 refills | Status: AC

## 2018-12-26 MED ORDER — SODIUM CHLORIDE 0.9 % IV BOLUS
1000.0000 mL | Freq: Once | INTRAVENOUS | Status: AC
  Administered 2018-12-26: 1000 mL via INTRAVENOUS

## 2018-12-26 MED ORDER — ONDANSETRON HCL 4 MG/2ML IJ SOLN
4.0000 mg | Freq: Once | INTRAMUSCULAR | Status: AC
  Administered 2018-12-26: 20:00:00 4 mg via INTRAVENOUS
  Filled 2018-12-26: qty 2

## 2018-12-26 MED ORDER — ALBUTEROL SULFATE HFA 108 (90 BASE) MCG/ACT IN AERS
1.0000 | INHALATION_SPRAY | Freq: Once | RESPIRATORY_TRACT | Status: AC
  Administered 2018-12-26: 2 via RESPIRATORY_TRACT
  Filled 2018-12-26: qty 6.7

## 2018-12-26 MED ORDER — PANTOPRAZOLE SODIUM 20 MG PO TBEC: 20.0000 mg | DELAYED_RELEASE_TABLET | Freq: Every day | ORAL | 1 refills | Status: DC

## 2018-12-26 MED ORDER — ONDANSETRON 4 MG PO TBDP: 4.0000 mg | ORAL_TABLET | Freq: Three times a day (TID) | ORAL | 0 refills | Status: DC | PRN

## 2018-12-26 MED ORDER — ALBUTEROL SULFATE (2.5 MG/3ML) 0.083% IN NEBU: 2.5000 mg | INHALATION_SOLUTION | Freq: Once | RESPIRATORY_TRACT | Status: DC

## 2018-12-26 NOTE — ED Provider Notes (Signed)
[facility] EMERGENCY DEPARTMENT Provider Note   CSN: [id number] Arrival date & time: 12/26/18  1418     History   Chief Complaint Chief Complaint  Patient presents with  . Abdominal Pain    HPI Johnathan Smith is a 48 y.o. male.     HPI  This patient is a 48 year old male, he has a known history of COPD, hypertension and a history of seizure disorder.  He also has a history of of fairly heavy alcohol use which started many years ago and recently he has improved significantly stating that he drinks very little alcohol now, since his last admission to the hospital 11 days ago he is only have 3 or 4 beers.  He was admitted to the hospital at that time for altered mental status and being obtunded after becoming intoxicated and having a seizure.  He states that he has had some abdominal pain for years, he was initially evaluated in [place] where he used to live, he states that he had hepatitis C and underwent treatment and is now in remission.  He states that he has never had any abdominal surgery including cholecystitis or appendicitis.  He was evaluated on October 16 as well as again on October 29 for causes of abdominal pain, the CT scans did not reveal any acute causes.  The patient denies having kidney stones, denies urinary symptoms, he does endorse having multiple episodes of vomiting and diarrhea today.  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  He is taking an proton pump inhibitor for acid reflux.  The pain is located in the mid abdomen, seems to be constant, comes in waves and crampy spells.  Past Medical History:  Diagnosis Date  . Asthma   . COPD (chronic obstructive pulmonary disease) (HCC)   . Diabetes mellitus without complication (HCC)   . Hypertension   . OSA (obstructive sleep apnea)   . Seizure [facility])     Patient Active Problem List   Diagnosis Date Noted  . Acute respiratory failure with hypoxia and  hypercapnia (HCC) 12/10/2018  . Seizure (HCC) 12/10/2018  . Alcohol intoxication (HCC) 12/10/2018  . OSA (obstructive sleep apnea) 12/10/2018  . Respiratory failure with hypoxia (HCC) 12/10/2018    History reviewed. No pertinent surgical history.      Home Medications    Prior to Admission medications   Medication Sig Start Date End Date Taking? Authorizing Provider  albuterol (VENTOLIN HFA) 108 (90 Base) MCG/ACT inhaler Inhale 1-2 puffs into the lungs every 6 (six) hours as needed for wheezing or shortness of breath.   Yes [provider]  atorvastatin (LIPITOR) 10 MG tablet Take 1 tablet (10 mg total) by mouth daily. 12/11/18  Yes Emokpae, Courage, MD  Dulaglutide (TRULICITY) 1.5 MG/0.5ML SOPN Inject 1.5 mg into the skin once a week.    Yes [provider]  glimepiride (AMARYL) 4 MG tablet Take 8 mg by mouth daily with breakfast.    Yes [provider]  lisinopril (ZESTRIL) 40 MG tablet Take 1 tablet (40 mg total) by mouth daily. 12/11/18  Yes Emokpae, Courage, MD  metFORMIN (GLUCOPHAGE) 1000 MG tablet Take 1,000 mg by mouth 2 (two) times daily with a meal.   Yes [provider]  mometasone-formoterol (DULERA) 200-5 MCG/ACT AERO Inhale 2 puffs into the lungs 2 (two) times daily.   Yes [provider]  omeprazole (PRILOSEC) 40 MG capsule Take 40 mg by mouth daily as  needed (for GERD).    Yes [provider]  tiotropium (SPIRIVA) 18 MCG inhalation capsule Place 18 mcg into inhaler and inhale daily.   Yes [provider]  dicyclomine (BENTYL) 20 MG tablet Take 1 tablet (20 mg total) by mouth 4 (four) times daily -  before meals and at bedtime for 15 days. 12/26/18 01/10/19  Eber Hong, MD  folic acid (FOLVITE) 1 MG tablet Take 1 tablet (1 mg total) by mouth daily. Patient not taking: Reported on 12/26/2018 12/11/18 12/11/19  Shon Hale, MD  ondansetron (ZOFRAN ODT) 4 MG disintegrating tablet Take 1 tablet (4 mg total) by  mouth every 8 (eight) hours as needed for nausea. 12/26/18   Eber Hong, MD  ondansetron (ZOFRAN) 4 MG tablet Take 1 tablet (4 mg total) by mouth every 8 (eight) hours as needed for nausea or vomiting. Patient not taking: Reported on 12/26/2018 12/11/18   Shon Hale, MD  pantoprazole (PROTONIX) 20 MG tablet Take 1 tablet (20 mg total) by mouth daily. 12/26/18   Eber Hong, MD  thiamine (VITAMIN B-1) 100 MG tablet Take 1 tablet (100 mg total) by mouth daily. Patient not taking: Reported on 12/26/2018 12/11/18   Shon Hale, MD    Family History Family History  Family history unknown: Yes    Social History Social History   Tobacco Use  . Smoking status: Current Some Day Smoker  . Smokeless tobacco: Never Used  Substance Use Topics  . Alcohol use: Yes    Alcohol/week: 6.0 standard drinks    Types: 6 Cans of beer per week    Comment: a week  . Drug use: Yes    Types: Marijuana     Allergies   Banana   Review of Systems Review of Systems  All other systems reviewed and are negative.    Physical Exam Updated Vital Signs BP (!) 118/93   Pulse 87   Temp 98.2 F (36.8 C) (Oral)   Resp 20   Ht 1.753 m ( )   Wt 122.5 kg   SpO2 (!) 89%   BMI 39.87 kg/m   Physical Exam Vitals signs and nursing note reviewed.  Constitutional:      General: He is not in acute distress.    Appearance: He is well-developed.  HENT:     Head: Normocephalic and atraumatic.     Mouth/Throat:     Pharynx: No oropharyngeal exudate.  Eyes:     General: No scleral icterus.       Right eye: No discharge.        Left eye: No discharge.     Conjunctiva/sclera: Conjunctivae normal.     Pupils: Pupils are equal, round, and reactive to light.  Neck:     Musculoskeletal: Normal range of motion and neck supple.     Thyroid: No thyromegaly.     Vascular: No JVD.  Cardiovascular:     Rate and Rhythm: Normal rate and regular rhythm.     Heart sounds: Normal heart sounds. No  murmur. No friction rub. No gallop.   Pulmonary:     Effort: Pulmonary effort is normal. No respiratory distress.     Breath sounds: Normal breath sounds. No wheezing or rales.  Abdominal:     General: Bowel sounds are normal. There is no distension.     Palpations: Abdomen is soft. There is no mass.     Tenderness: There is abdominal tenderness ( Right lower, right upper, epigastric and mid abdominal tenderness with  less pain on the left side.).  Musculoskeletal: Normal range of motion.        General: No tenderness.     Right lower leg: Edema present.     Left lower leg: Edema present.     Comments: Chronic lower extremity edema, patient states it is chronic  Lymphadenopathy:     Cervical: No cervical adenopathy.  Skin:    General: Skin is warm and dry.     Findings: No erythema or rash.  Neurological:     Mental Status: He is alert.     Coordination: Coordination normal.  Psychiatric:        Behavior: Behavior normal.      ED Treatments / Results  Labs (all labs ordered are listed, but only abnormal results are displayed) Labs Reviewed  COMPREHENSIVE METABOLIC PANEL - Abnormal; Notable for the following components:      Result Value   Glucose, Bld 259 (*)    Calcium 8.8 (*)    All other components within normal limits  CBC - Abnormal; Notable for the following components:   WBC 11.6 (*)    All other components within normal limits  URINALYSIS, ROUTINE W REFLEX MICROSCOPIC - Abnormal; Notable for the following components:   Specific Gravity, Urine 1.032 (*)    Protein, ur 100 (*)    All other components within normal limits  LIPASE, BLOOD    EKG None  Radiology Acute Abd Series  Result Date: 12/26/2018 CLINICAL DATA:  Abdominal pain for 1 month, nausea vomiting. EXAM: DG ABDOMEN ACUTE W/ 1V CHEST COMPARISON:  CT abdomen and pelvis of 12/10/2018 FINDINGS: Lungs are clear. Cardiomediastinal contours and pulmonary vasculature are normal. No free air beneath either  right or left hemidiaphragm. Signs of moderately dilated small bowel in the central abdomen up to 5 cm. Minimal gas seen throughout the abdomen. No colonic gas. Air-fluid levels noted with string of pearls sign in the left hemiabdomen. No acute bone process. IMPRESSION: Findings suspicious for small bowel obstruction. No acute cardiopulmonary disease. No signs of free air. Electronically Signed   By: Donzetta KohutGeoffrey  Wile M.D.   On: 12/26/2018 21:18    Procedures Procedures (including critical care time)  Medications Ordered in ED Medications  sodium chloride 0.9 % bolus 1,000 mL (1,000 mLs Intravenous New Bag/Given 12/26/18 1959)  ondansetron (ZOFRAN) injection 4 mg (4 mg Intravenous Given 12/26/18 1952)  HYDROmorphone (DILAUDID) injection 1 mg (1 mg Intravenous Given 12/26/18 1952)  albuterol (VENTOLIN HFA) 108 (90 Base) MCG/ACT inhaler 1-2 puff (2 puffs Inhalation Given 12/26/18 2126)     Initial Impression / Assessment and Plan / ED Course  I have reviewed the triage vital signs and the nursing notes.  Pertinent labs & imaging results that were available during my care of the patient were reviewed by me and considered in my medical decision making (see chart for details).        This patient's vital signs are rather unremarkable, his exam is consistent with some element of gastroenteritis.  He does not have focal tenderness over McBurney's point or a Murphy sign but rather has diffuse right-sided epigastric and mid abdominal tenderness.  There is no peritoneal signs, he has been having vomiting and watery diarrhea today.  His labs show only a very mild and nonspecific leukocytosis without any liver or renal dysfunction.  Urinalysis pending, lipase normal, will give fluids Zofran pain medications and repeat evaluation.  The patient's laboratory work-up was rather benign, his x-ray showed findings of  a possible small bowel obstruction however the patient states that he was feeling much better and  actually wanted to eat and drink.  After a p.o. trial of a can of soda as well as some crackers the patient states he was still feeling great and wanted to go home.  He has no nausea or vomiting and his abdomen is much less tender at this time.  I do not think he needs another CT scan given the fact that he has recently had a couple of CT scans which showed no signs of obstruction  The patient is agreeable to follow-up, he will be given a list of local family doctors.  Final Clinical Impressions(s) / ED Diagnoses   Final diagnoses:  Periumbilical abdominal pain    ED Discharge Orders         Ordered    dicyclomine (BENTYL) 20 MG tablet  3 times daily before meals & bedtime     12/26/18 2225    ondansetron (ZOFRAN ODT) 4 MG disintegrating tablet  Every 8 hours PRN     12/26/18 2225    pantoprazole (PROTONIX) 20 MG tablet  Daily     12/26/18 2225           Noemi Chapel, MD 12/26/18 2227

## 2018-12-26 NOTE — Discharge Instructions (Signed)
Your testing today did not show any specific findings, this is reassuring, you have been able to eat and drink without difficulty which is also reassuring.  You may take the albuterol inhaler which we have given you, 2 puffs every 4 hours as needed for shortness of breath wheezing or coughing  Please take dicyclomine every 6 hours as needed for abdominal pain or cramping, Zofran every 6 hours as needed, Protonix daily instead of taking omeprazole.  It is stronger.  Return to the emergency department for severe or worsening symptoms.  See the list of family doctors below for local family doctor follow-up.  [facility] Primary Care Doctor List    Sinda Du MD. Specialty: Pulmonary Disease Contact information: [address]  [place] [address]  [phone]   Tula Nakayama, MD. Specialty: [facility] Medicine Contact information: [address], [place] [address]  [phone]   Sallee Lange, MD. Specialty: [facility] Medicine Contact information: [place]  [place] [address]  [phone]   Rosita Fire, MD Specialty: Internal Medicine Contact information: [place] [address]  [phone]   Delphina Cahill, MD. Specialty: Internal Medicine Contact information: [place] [address]  [phone]    [facility] (Dr. Maudie Mercury) Specialty: Family Medicine Contact information: [place] [address]  [phone]   Leslie Andrea, MD. Specialty: [facility] Medicine Contact information: [place] [address]  [phone]   Asencion Noble, MD. Specialty: Internal Medicine Contact information: [address]  [place] [address]  [place]  [address] [place],  [address] [phone]  Services [facility] offers a variety of  basic health services.  Services include but are not limited to: Blood pressure checks  Heart rate checks  Blood sugar checks  Urine analysis  Rapid strep tests  Pregnancy tests.  Health education and referrals  People needing more complex services will be directed to a physician online. Using these virtual visits, doctors can evaluate and prescribe medicine and treatments. There will be no medication on-site, though [facility] will help patients fill their prescriptions at little to no cost.   For More information please go to: [email or web address]

## 2019-01-26 ENCOUNTER — Other Ambulatory Visit: Payer: Self-pay

## 2019-01-26 ENCOUNTER — Ambulatory Visit (INDEPENDENT_AMBULATORY_CARE_PROVIDER_SITE_OTHER): Payer: Self-pay | Admitting: Family Medicine

## 2019-01-26 ENCOUNTER — Encounter (INDEPENDENT_AMBULATORY_CARE_PROVIDER_SITE_OTHER): Payer: Self-pay

## 2019-01-26 ENCOUNTER — Encounter: Payer: Self-pay | Admitting: Family Medicine

## 2019-01-26 VITALS — BP 148/80 | HR 90 | Temp 98.5°F | Resp 15 | Ht 69.0 in | Wt 276.1 lb

## 2019-01-26 DIAGNOSIS — J4540 Moderate persistent asthma, uncomplicated: Secondary | ICD-10-CM | POA: Insufficient documentation

## 2019-01-26 DIAGNOSIS — K219 Gastro-esophageal reflux disease without esophagitis: Secondary | ICD-10-CM | POA: Insufficient documentation

## 2019-01-26 DIAGNOSIS — Z720 Tobacco use: Secondary | ICD-10-CM | POA: Insufficient documentation

## 2019-01-26 DIAGNOSIS — E119 Type 2 diabetes mellitus without complications: Secondary | ICD-10-CM | POA: Insufficient documentation

## 2019-01-26 DIAGNOSIS — Z23 Encounter for immunization: Secondary | ICD-10-CM

## 2019-01-26 DIAGNOSIS — I10 Essential (primary) hypertension: Secondary | ICD-10-CM

## 2019-01-26 DIAGNOSIS — Z7289 Other problems related to lifestyle: Secondary | ICD-10-CM

## 2019-01-26 DIAGNOSIS — Z6841 Body Mass Index (BMI) 40.0 and over, adult: Secondary | ICD-10-CM

## 2019-01-26 DIAGNOSIS — Z789 Other specified health status: Secondary | ICD-10-CM

## 2019-01-26 MED ORDER — DULERA 200-5 MCG/ACT IN AERO: 2.0000 | INHALATION_SPRAY | Freq: Two times a day (BID) | RESPIRATORY_TRACT | 3 refills | Status: DC

## 2019-01-26 MED ORDER — TIOTROPIUM BROMIDE MONOHYDRATE 18 MCG IN CAPS: 18.0000 ug | ORAL_CAPSULE | Freq: Every day | RESPIRATORY_TRACT | 3 refills | Status: DC

## 2019-01-26 MED ORDER — ATORVASTATIN CALCIUM 10 MG PO TABS: 10.0000 mg | ORAL_TABLET | Freq: Every day | ORAL | 1 refills | Status: DC

## 2019-01-26 MED ORDER — VITAMIN B-1 100 MG PO TABS: 100.0000 mg | ORAL_TABLET | Freq: Every day | ORAL | 2 refills | Status: AC

## 2019-01-26 MED ORDER — ALBUTEROL SULFATE HFA 108 (90 BASE) MCG/ACT IN AERS: 1.0000 | INHALATION_SPRAY | Freq: Four times a day (QID) | RESPIRATORY_TRACT | 3 refills | Status: DC | PRN

## 2019-01-26 MED ORDER — GLIMEPIRIDE 4 MG PO TABS: 8.0000 mg | ORAL_TABLET | Freq: Every day | ORAL | 3 refills | Status: DC

## 2019-01-26 MED ORDER — OMEPRAZOLE 40 MG PO CPDR: 40.0000 mg | DELAYED_RELEASE_CAPSULE | Freq: Every day | ORAL | 3 refills | Status: DC | PRN

## 2019-01-26 MED ORDER — LISINOPRIL 40 MG PO TABS: 40.0000 mg | ORAL_TABLET | Freq: Every day | ORAL | 1 refills | Status: DC

## 2019-01-26 MED ORDER — METFORMIN HCL 1000 MG PO TABS: 1000.0000 mg | ORAL_TABLET | Freq: Two times a day (BID) | ORAL | 3 refills | Status: DC

## 2019-01-26 NOTE — Progress Notes (Signed)
Subjective:     Patient ID: Johnathan Smith Lynam, male   DOB: January 26, 1971, 48 y.o.   MRN: [id number]  Johnathan Smith Wetmore presents for New Patient (Initial Visit) (establish care, needs medications refilled)  Mr. Jules HusbandsDyer is a 48 year old male patient who comes here to establish care today.  Moved to [place] ago from [place].  Has not had a PCP established yet.  Reports that he needs refills on his medications for his diabetes, asthma, cholesterol and other medications as well. Current history includes asthma, COPD, current smoker, diabetes without insulin use, hypertension, obstructive sleep apnea, seizures.  Reports that he is a current every day smoker but is reducing the amount that he is smoking.  Reports that he is down to 2 cigarettes a week if that.  He also drinks but is trying to reduce the amount of alcohol that he is drinking.  Symptoms that he reports he has right now is a cough and shortness of breath related to his asthma and needing his inhalers.  Visual disturbance secondary to losing his glasses.  Polydipsia, polyphagia.  Reports that he needs his diabetic medications.  Headaches and dizziness related to possible poor control of diabetes, hypertension and loss of glasses.  Is willing to get flu vaccine today.  Reports money is tight at this time.  Needs to get updated labs.  Reports that he is waiting for insurance to kick in.  Applied for Medicaid here in [place].  Of note he was in the emergency room back in October secondary to having a witnessed seizure.  It was believed that he had passed out/had alcohol intoxication at that time.  He was encouraged to abstain from alcohol and tobacco.  Follow-up with getting a PCP.  Does need to have CPAP machine for treatment of obstructive sleep apnea.  Also need to get PCP for help management of chronic conditions.  Today patient denies signs and symptoms of COVID 19 infection including fever, chills, cough, shortness of breath,  and headache.  Past Medical, Surgical, Social History, Allergies, and Medications have been Reviewed.   Past Medical History:  Diagnosis Date  . Asthma   . COPD (chronic obstructive pulmonary disease) (HCC)   . Diabetes mellitus without complication (HCC)   . Hypertension   . OSA (obstructive sleep apnea)   . Seizure (HCC)    No past surgical history on file. Social History   Socioeconomic History  . Marital status: Single    Spouse name: Not on file  . Number of children: Not on file  . Years of education: Not on file  . Highest education level: Not on file  Occupational History  . Not on file  Tobacco Use  . Smoking status: Current Some Day Smoker  . Smokeless tobacco: Never Used  Substance and Sexual Activity  . Alcohol use: Yes    Alcohol/week: 6.0 standard drinks    Types: 6 Cans of beer per week    Comment: a week  . Drug use: Yes    Types: Marijuana  . Sexual activity: Not on file  Other Topics Concern  . Not on file  Social History Narrative  . Not on file   Social Determinants of Health   Financial Resource Strain:   . Difficulty of Paying Living Expenses: Not on file  Food Insecurity:   . Worried About Programme researcher, broadcasting/film/videounning Out of Food in the Last Year: Not on file  . Ran Out of Food in the Last Year: Not  on file  Transportation Needs:   . Lack of Transportation (Medical): Not on file  . Lack of Transportation (Non-Medical): Not on file  Physical Activity:   . Days of Exercise per Week: Not on file  . Minutes of Exercise per Session: Not on file  Stress:   . Feeling of Stress : Not on file  Social Connections:   . Frequency of Communication with Friends and Family: Not on file  . Frequency of Social Gatherings with Friends and Family: Not on file  . Attends Religious Services: Not on file  . Active Member of Clubs or Organizations: Not on file  . Attends Banker Meetings: Not on file  . Marital Status: Not on file  Intimate Partner Violence:    . Fear of Current or Ex-Partner: Not on file  . Emotionally Abused: Not on file  . Physically Abused: Not on file  . Sexually Abused: Not on file    Outpatient Encounter Medications as of 01/26/2019  Medication Sig  . albuterol (VENTOLIN HFA) 108 (90 Base) MCG/ACT inhaler Inhale 1-2 puffs into the lungs every 6 (six) hours as needed for wheezing or shortness of breath.  Marland Kitchen atorvastatin (LIPITOR) 10 MG tablet Take 1 tablet (10 mg total) by mouth daily.  . Dulaglutide (TRULICITY) 1.5 MG/0.5ML SOPN Inject 1.5 mg into the skin once a week.   Marland Kitchen glimepiride (AMARYL) 4 MG tablet Take 8 mg by mouth daily with breakfast.   . lisinopril (ZESTRIL) 40 MG tablet Take 1 tablet (40 mg total) by mouth daily.  . metFORMIN (GLUCOPHAGE) 1000 MG tablet Take 1,000 mg by mouth 2 (two) times daily with a meal.  . mometasone-formoterol (DULERA) 200-5 MCG/ACT AERO Inhale 2 puffs into the lungs 2 (two) times daily.  Marland Kitchen omeprazole (PRILOSEC) 40 MG capsule Take 40 mg by mouth daily as needed (for GERD).   Marland Kitchen thiamine (VITAMIN B-1) 100 MG tablet Take 1 tablet (100 mg total) by mouth daily.  Marland Kitchen tiotropium (SPIRIVA) 18 MCG inhalation capsule Place 18 mcg into inhaler and inhale daily.  Marland Kitchen dicyclomine (BENTYL) 20 MG tablet Take 1 tablet (20 mg total) by mouth 4 (four) times daily -  before meals and at bedtime for 15 days.  . [DISCONTINUED] folic acid (FOLVITE) 1 MG tablet Take 1 tablet (1 mg total) by mouth daily. (Patient not taking: Reported on 12/26/2018)  . [DISCONTINUED] ondansetron (ZOFRAN ODT) 4 MG disintegrating tablet Take 1 tablet (4 mg total) by mouth every 8 (eight) hours as needed for nausea. (Patient not taking: Reported on 01/26/2019)  . [DISCONTINUED] ondansetron (ZOFRAN) 4 MG tablet Take 1 tablet (4 mg total) by mouth every 8 (eight) hours as needed for nausea or vomiting. (Patient not taking: Reported on 12/26/2018)  . [DISCONTINUED] pantoprazole (PROTONIX) 20 MG tablet Take 1 tablet (20 mg total) by mouth daily.  (Patient not taking: Reported on 01/26/2019)   No facility-administered encounter medications on file as of 01/26/2019.   Allergies  Allergen Reactions  . Banana Swelling    "swelling" of the skin    Review of Systems  HENT: Negative.   Eyes: Positive for visual disturbance.       Does not have glasses   Respiratory: Positive for cough and shortness of breath.        Asthma and allergy related   Cardiovascular: Negative.   Gastrointestinal: Negative.   Endocrine: Positive for polydipsia and polyphagia.  Genitourinary: Negative.   Musculoskeletal: Negative.   Skin: Negative.   Allergic/Immunologic:  Negative.   Neurological: Positive for dizziness and headaches.  Hematological: Negative.   Psychiatric/Behavioral: Negative.   All other systems reviewed and are negative.      Objective:     BP (!) 148/80   Pulse 90   Temp 98.5 F (36.9 C) (Oral)   Resp 15   Ht 5\' 9"  (1.753 m)   Wt 276 lb 1.9 oz (125.2 kg)   SpO2 94%   BMI 40.78 kg/m   Physical Exam Vitals and nursing note reviewed.  Constitutional:      Appearance: Normal appearance. He is well-developed and well-groomed. He is obese.  HENT:     Head: Normocephalic and atraumatic.     Right Ear: External ear normal.     Left Ear: External ear normal.     Nose: Nose normal.     Mouth/Throat:     Mouth: Mucous membranes are moist.     Pharynx: Oropharynx is clear.  Eyes:     General:        Right eye: No discharge.        Left eye: No discharge.     Conjunctiva/sclera: Conjunctivae normal.  Cardiovascular:     Rate and Rhythm: Normal rate and regular rhythm.     Pulses: Normal pulses.     Heart sounds: Normal heart sounds.  Pulmonary:     Effort: Pulmonary effort is normal.     Breath sounds: Normal breath sounds.  Musculoskeletal:        General: Normal range of motion.     Cervical back: Normal range of motion and neck supple.  Skin:    General: Skin is warm.  Neurological:     General: No focal  deficit present.     Mental Status: He is alert and oriented to person, place, and time.  Psychiatric:        Attention and Perception: Attention normal.        Mood and Affect: Mood normal.        Speech: Speech normal.        Behavior: Behavior normal. Behavior is cooperative.        Thought Content: Thought content normal.        Cognition and Memory: Cognition normal.        Judgment: Judgment normal.        Assessment and Plan       1. Class 3 severe obesity due to excess calories with serious comorbidity and body mass index (BMI) of 40.0 to 44.9 in adult [facility]) Strongly encouraged to review diet and maintain good control over diet and including exercise.  Encouraged to eat a heart healthy, low sugar low carbohydrate diabetic diet.  Given the living circumstances understand that this can be little bit difficult at times as he currently lives in a hotel.  Strongly encouraged just to pick the best foods possible like salads whole fruits and vegetables and limited fried foods and take out if possible.  2. Diabetes mellitus without complication (HCC) Denies having any issues with taking any of the medications that he is prescribed for diabetes.  Reports that he wants to make sure he is taking good care of himself.  Will be getting labs and refills on these.  Adjustment of medications pending labs will be discussed.  Patient is in agreements with care plan.  - COMPLETE METABOLIC PANEL WITH GFR - CBC - Hemoglobin A1c - Lipid panel - atorvastatin (LIPITOR) 10 MG tablet; Take 1 tablet (10 mg  total) by mouth daily.  Dispense: 30 tablet; Refill: 1 - metFORMIN (GLUCOPHAGE) 1000 MG tablet; Take 1 tablet (1,000 mg total) by mouth 2 (two) times daily with a meal.  Dispense: 60 tablet; Refill: 3 - glimepiride (AMARYL) 4 MG tablet; Take 2 tablets (8 mg total) by mouth daily with breakfast.  Dispense: 30 tablet; Refill: 3 - thiamine (VITAMIN B-1) 100 MG tablet; Take 1 tablet (100 mg total) by mouth  daily.  Dispense: 30 tablet; Refill: 2  3. Essential hypertension Blood pressure is at borderline control.  Advised to continue medications at this time.  Will be checking labs.  In addition to getting release forms from previous provider.  Pending labs and next check of blood pressure will adjust medications if needed.  - COMPLETE METABOLIC PANEL WITH GFR - CBC - lisinopril (ZESTRIL) 40 MG tablet; Take 1 tablet (40 mg total) by mouth daily.  Dispense: 30 tablet; Refill: 1  4. Moderate persistent asthma without complication Reports mostly controlled.  Just needs refill on inhalers at this time.  Advised for possible pulmonology referral in future for work-up if needed.  - tiotropium (SPIRIVA) 18 MCG inhalation capsule; Place 1 capsule (18 mcg total) into inhaler and inhale daily.  Dispense: 30 capsule; Refill: 3 - albuterol (VENTOLIN HFA) 108 (90 Base) MCG/ACT inhaler; Inhale 1-2 puffs into the lungs every 6 (six) hours as needed for wheezing or shortness of breath.  Dispense: 18 g; Refill: 3 - mometasone-formoterol (DULERA) 200-5 MCG/ACT AERO; Inhale 2 puffs into the lungs 2 (two) times daily.  Dispense: 13 g; Refill: 3  5. Gastroesophageal reflux disease without esophagitis Refill provided partially controlled.  Encouraged to reduce smoking and drinking as this can help with reduction in GERD symptoms.  In addition to not eating several hours before going to bed.  And eating a more well-balanced diet lower in fat and acidic foods.  - omeprazole (PRILOSEC) 40 MG capsule; Take 1 capsule (40 mg total) by mouth daily as needed (for GERD).  Dispense: 30 capsule; Refill: 3  6. Nicotine abuse Asked about quitting: confirms they are currently smokes cigarettes Advise to quit smoking: Educated about QUITTING to reduce the risk of cancer, cardio and cerebrovascular disease. Assess willingness: Unwilling to quit at this time, but is working on cutting back. Assist with counseling and pharmacotherapy:  Counseled for 5 minutes and literature provided. Arrange for follow up: quitting follow up in 3 months and continue to offer help.    7. Alcohol use Encouraged continued reduction in alcohol use.   Follow Up: 3 months   Perlie Mayo, DNP, AGNP-BC [address], [address] [place], [place] [address] Office Hours: Mon-Thurs 8 am-5 pm; Fri 8 am-12 pm Office Phone:  [phone]  Office Fax: [phone]

## 2019-01-26 NOTE — Patient Instructions (Addendum)
  I appreciate the opportunity to provide you with care for your health and wellness. Today we discussed: establish care  Follow up: 3 months   Labs fasting once you can-if you want to wait for insurance -I understand, please just get these as soon as possible.  Refills provided today.  Flu vaccine today-tylenol can be used for a sore arm.  I hope you have a wonderful, happy, safe, and healthy Holiday Season! See you in the New Year :)  Please continue to practice social distancing to keep you, your family, and our community safe.  If you must go out, please wear a mask and practice good handwashing.  It was a pleasure to see you and I look forward to continuing to work together on your health and well-being. Please do not hesitate to call the office if you need care or have questions about your care.  Have a wonderful day and week. With Gratitude, Cherly Beach, DNP, AGNP-BC

## 2019-02-18 ENCOUNTER — Other Ambulatory Visit: Payer: Self-pay

## 2019-02-18 ENCOUNTER — Emergency Department (HOSPITAL_COMMUNITY)
Admission: EM | Admit: 2019-02-18 | Discharge: 2019-02-18 | Disposition: A | Discharge status: Home / Self Care | Payer: Self-pay | Attending: Emergency Medicine | Admitting: Emergency Medicine

## 2019-02-18 ENCOUNTER — Encounter (HOSPITAL_COMMUNITY): Payer: Self-pay | Admitting: Emergency Medicine

## 2019-02-18 ENCOUNTER — Emergency Department (HOSPITAL_COMMUNITY): Payer: Self-pay

## 2019-02-18 DIAGNOSIS — E119 Type 2 diabetes mellitus without complications: Secondary | ICD-10-CM | POA: Insufficient documentation

## 2019-02-18 DIAGNOSIS — J449 Chronic obstructive pulmonary disease, unspecified: Secondary | ICD-10-CM | POA: Insufficient documentation

## 2019-02-18 DIAGNOSIS — S63501A Unspecified sprain of right wrist, initial encounter: Secondary | ICD-10-CM | POA: Insufficient documentation

## 2019-02-18 DIAGNOSIS — Y999 Unspecified external cause status: Secondary | ICD-10-CM | POA: Insufficient documentation

## 2019-02-18 DIAGNOSIS — I10 Essential (primary) hypertension: Secondary | ICD-10-CM | POA: Insufficient documentation

## 2019-02-18 DIAGNOSIS — Y92480 Sidewalk as the place of occurrence of the external cause: Secondary | ICD-10-CM | POA: Insufficient documentation

## 2019-02-18 DIAGNOSIS — J45909 Unspecified asthma, uncomplicated: Secondary | ICD-10-CM | POA: Insufficient documentation

## 2019-02-18 DIAGNOSIS — Y93K1 Activity, walking an animal: Secondary | ICD-10-CM | POA: Insufficient documentation

## 2019-02-18 DIAGNOSIS — Z7984 Long term (current) use of oral hypoglycemic drugs: Secondary | ICD-10-CM | POA: Insufficient documentation

## 2019-02-18 DIAGNOSIS — F17210 Nicotine dependence, cigarettes, uncomplicated: Secondary | ICD-10-CM | POA: Insufficient documentation

## 2019-02-18 DIAGNOSIS — F1210 Cannabis abuse, uncomplicated: Secondary | ICD-10-CM | POA: Insufficient documentation

## 2019-02-18 DIAGNOSIS — X509XXA Other and unspecified overexertion or strenuous movements or postures, initial encounter: Secondary | ICD-10-CM | POA: Insufficient documentation

## 2019-02-18 DIAGNOSIS — Z79899 Other long term (current) drug therapy: Secondary | ICD-10-CM | POA: Insufficient documentation

## 2019-02-18 MED ORDER — CYCLOBENZAPRINE HCL 10 MG PO TABS: 10.0000 mg | ORAL_TABLET | Freq: Two times a day (BID) | ORAL | 0 refills | Status: AC | PRN

## 2019-02-18 MED ORDER — IBUPROFEN 600 MG PO TABS: 600.0000 mg | ORAL_TABLET | Freq: Four times a day (QID) | ORAL | 0 refills | Status: AC | PRN

## 2019-02-18 NOTE — ED Notes (Signed)
Wrist pain just today per pt   Cannot move R wrist at this time

## 2019-02-18 NOTE — ED Notes (Signed)
Pt denies injury of any kind.

## 2019-02-18 NOTE — ED Triage Notes (Signed)
Pt c/o right wrist pain

## 2019-02-18 NOTE — ED Provider Notes (Signed)
[facility] EMERGENCY DEPARTMENT Provider Note   CSN: [id number] Arrival date & time: 02/18/19  2047     History Chief Complaint  Patient presents with  . Wrist Pain    Johnathan Smith is a 49 y.o. male.  The history is provided by the patient. No language interpreter was used.  Wrist Pain     49 year old male with history of diabetes, hypertension, seizures, COPD, asthma, anxiety presenting complaining of wrist pain.  Patient report this morning he was walking his dog while holding the leash.  His dog jerked and ran, causing a jerk motion to his right wrist.  He immediately experienced acute onset of sharp pain to the ulnar aspects of the wrist.  Pain radiates to forearm.  Since then he noticed increasing swelling to the wrist and hand with throbbing pain worse with any kind of movement.  No numbness.  He is having difficulty making a fist due to the pain.  No elbow pain.  He is right-hand dominant.  He tries taking ibuprofen and Ace wrap without adequate relief.  Past Medical History:  Diagnosis Date  . Anxiety   . Asthma   . COPD (chronic obstructive pulmonary disease) (HCC)   . Depression   . Diabetes mellitus without complication (HCC)   . Hypertension   . OSA (obstructive sleep apnea)   . Seizure [facility])     Patient Active Problem List   Diagnosis Date Noted  . Diabetes mellitus without complication (HCC) 01/26/2019  . Class 3 severe obesity due to excess calories with serious comorbidity and body mass index (BMI) of 40.0 to 44.9 in adult (HCC) 01/26/2019  . Essential hypertension 01/26/2019  . Moderate persistent asthma without complication 01/26/2019  . Gastroesophageal reflux disease without esophagitis 01/26/2019  . Nicotine abuse 01/26/2019  . Acute respiratory failure with hypoxia and hypercapnia (HCC) 12/10/2018  . Seizure (HCC) 12/10/2018  . Alcohol intoxication (HCC) 12/10/2018  . OSA (obstructive sleep apnea) 12/10/2018  . Respiratory failure with hypoxia  (HCC) 12/10/2018    History reviewed. No pertinent surgical history.     Family History  Problem Relation Age of Onset  . Cancer Mother        breast   . Diabetes Mother   . Hypertension Mother   . Hyperlipidemia Mother     Social History   Tobacco Use  . Smoking status: Current Some Day Smoker    Types: Cigarettes  . Smokeless tobacco: Never Used  Substance Use Topics  . Alcohol use: Yes    Alcohol/week: 6.0 standard drinks    Types: 6 Cans of beer per week    Comment: 2-4 every 3-4 days   . Drug use: Yes    Frequency: 7.0 times per week    Types: Marijuana    Home Medications Prior to Admission medications   Medication Sig Start Date End Date Taking? Authorizing Provider  albuterol (VENTOLIN HFA) 108 (90 Base) MCG/ACT inhaler Inhale 1-2 puffs into the lungs every 6 (six) hours as needed for wheezing or shortness of breath. 01/26/19   Johnathan Finner, NP  atorvastatin (LIPITOR) 10 MG tablet Take 1 tablet (10 mg total) by mouth daily. 01/26/19   Johnathan Finner, NP  dicyclomine (BENTYL) 20 MG tablet Take 1 tablet (20 mg total) by mouth 4 (four) times daily -  before meals and at bedtime for 15 days. 12/26/18 01/10/19  Eber Hong, MD  Dulaglutide (TRULICITY) 1.5 MG/0.5ML SOPN Inject 1.5 mg into the skin once a week.  [provider]  glimepiride (AMARYL) 4 MG tablet Take 2 tablets (8 mg total) by mouth daily with breakfast. 01/26/19   Johnathan Finner, NP  lisinopril (ZESTRIL) 40 MG tablet Take 1 tablet (40 mg total) by mouth daily. 01/26/19   Johnathan Finner, NP  metFORMIN (GLUCOPHAGE) 1000 MG tablet Take 1 tablet (1,000 mg total) by mouth 2 (two) times daily with a meal. 01/26/19   Johnathan Finner, NP  mometasone-formoterol (DULERA) 200-5 MCG/ACT AERO Inhale 2 puffs into the lungs 2 (two) times daily. 01/26/19   Johnathan Finner, NP  omeprazole (PRILOSEC) 40 MG capsule Take 1 capsule (40 mg total) by mouth daily as needed (for GERD). 01/26/19   Johnathan Finner,  NP  thiamine (VITAMIN B-1) 100 MG tablet Take 1 tablet (100 mg total) by mouth daily. 01/26/19   Johnathan Finner, NP  tiotropium (SPIRIVA) 18 MCG inhalation capsule Place 1 capsule (18 mcg total) into inhaler and inhale daily. 01/26/19   Johnathan Finner, NP    Allergies    Banana  Review of Systems   Review of Systems  Constitutional: Negative for fever.  Musculoskeletal: Positive for arthralgias.  Neurological: Negative for numbness.    Physical Exam Updated Vital Signs BP 123/84 (BP Location: Left Arm)   Pulse 95   Temp 98.5 F (36.9 C) (Oral)   Resp 18   Ht 5\' 9"  (1.753 m)   Wt 113.4 kg   SpO2 96%   BMI 36.92 kg/m   Physical Exam Vitals and nursing note reviewed.  Constitutional:      General: He is not in acute distress.    Appearance: He is well-developed.  HENT:     Head: Atraumatic.  Eyes:     Conjunctiva/sclera: Conjunctivae normal.  Musculoskeletal:        General: Tenderness (Right wrist: Tenderness to both radial and ulnar aspect of the wrist with decreased wrist flexion extension supination pronation and lateral movement secondary to pain.  No obvious deformity noted.  Normal radial pulse.  Mild swelling appreciated ) present.     Cervical back: Neck supple.  Skin:    Capillary Refill: Capillary refill takes less than 2 seconds.     Findings: No rash.  Neurological:     Mental Status: He is alert and oriented to person, place, and time.     ED Results / Procedures / Treatments   Labs (all labs ordered are listed, but only abnormal results are displayed) Labs Reviewed - No data to display  EKG None  Radiology DG Wrist Complete Right  Result Date: 02/18/2019 CLINICAL DATA:  Injury EXAM: RIGHT WRIST - COMPLETE 3+ VIEW COMPARISON:  None. FINDINGS: No fracture or dislocation of the right wrist. The carpus is normally aligned. Joint spaces are well preserved. Soft tissues are unremarkable. IMPRESSION: No fracture or dislocation of the right wrist.  Electronically Signed   By: 04/18/2019 M.D.   On: 02/18/2019 21:13    Procedures Procedures (including critical care time)  Medications Ordered in ED Medications - No data to display  ED Course  I have reviewed the triage vital signs and the nursing notes.  Pertinent labs & imaging results that were available during my care of the patient were reviewed by me and considered in my medical decision making (see chart for details).    MDM Rules/Calculators/A&P                      BP  123/84 (BP Location: Left Arm)   Pulse 95   Temp 98.5 F (36.9 C) (Oral)   Resp 18   Ht 5\' 9"  (1.753 m)   Wt 113.4 kg   SpO2 96%   BMI 36.92 kg/m   Final Clinical Impression(s) / ED Diagnoses Final diagnoses:  Sprain of right wrist, initial encounter    Rx / DC Orders ED Discharge Orders         Ordered    ibuprofen (ADVIL) 600 MG tablet  Every 6 hours PRN     02/18/19 2221    cyclobenzaprine (FLEXERIL) 10 MG tablet  2 times daily PRN     02/18/19 2221         10:20 PM Patient here complaining of right wrist injury when he was walking his dog and the dog pulled on the leash causing right wrist pain.  X-ray of the right wrist is without acute fracture or dislocation.  I suspect this is likely a wrist sprain.  He is neurovascularly intact.  Will provide wrist brace for support, rice therapy discussed.  Orthopedic referral given as needed   Doy Hutching 02/18/19 [place], MD 02/20/19 1515

## 2019-03-07 ENCOUNTER — Ambulatory Visit: Payer: Self-pay | Admitting: Family Medicine

## 2019-03-16 ENCOUNTER — Other Ambulatory Visit: Payer: Self-pay

## 2019-03-16 ENCOUNTER — Encounter: Payer: Self-pay | Admitting: Family Medicine

## 2019-03-16 ENCOUNTER — Ambulatory Visit (INDEPENDENT_AMBULATORY_CARE_PROVIDER_SITE_OTHER): Payer: Self-pay | Admitting: Family Medicine

## 2019-03-16 VITALS — Ht 69.0 in | Wt 248.0 lb

## 2019-03-16 DIAGNOSIS — F319 Bipolar disorder, unspecified: Secondary | ICD-10-CM

## 2019-03-16 DIAGNOSIS — M5136 Other intervertebral disc degeneration, lumbar region: Secondary | ICD-10-CM

## 2019-03-16 NOTE — Progress Notes (Signed)
Virtual Visit via Telephone Note   This visit type was conducted due to national recommendations for restrictions regarding the COVID-19 Pandemic (e.g. social distancing) in an effort to limit this patient's exposure and mitigate transmission in our community.  Due to his co-morbid illnesses, this patient is at least at moderate risk for complications without adequate follow up.  This format is felt to be most appropriate for this patient at this time.  The patient did not have access to video technology/had technical difficulties with video requiring transitioning to audio format only (telephone).  All issues noted in this document were discussed and addressed.  No physical exam could be performed with this format.    Evaluation Performed:  Follow-up visit  Date:  03/16/2019   ID:  Johnathan Smith, DOB 1970/03/23, MRN [id number]  Patient Location: Home Provider Location: Office  Location of Patient: Home Location of Provider: Telehealth Consent was obtain for visit to be over via telehealth. I verified that I am speaking with the correct person using two identifiers.  PCP:  Perlie Mayo, NP   Chief Complaint:  Reports back pain and need for referral for mental health   History of Present Illness:    Johnathan Smith is a 49 y.o. male with newly reported bipolar disorder and desire to be back on medications for this. Is willing to be referred. He is a new pt for me still. Established in Dec 2020. Recently moved to [place] from [place].   Also reports back is giving him trouble. In looking back at note, I do not see that he mentioned this at the first visit. He would like a referral to ortho.   He denies any other concerns.  The patient does not have symptoms concerning for COVID-19 infection (fever, chills, cough, or new shortness of breath).   Past Medical, Surgical, Social History, Allergies, and Medications have been Reviewed.  Past Medical History:  Diagnosis Date  . Anxiety   .  Asthma   . COPD (chronic obstructive pulmonary disease) ([place])   . Depression   . Diabetes mellitus without complication ([place])   . Hypertension   . OSA (obstructive sleep apnea)   . Seizure ([place])    No past surgical history on file.   Current Meds  Medication Sig  . albuterol (VENTOLIN HFA) 108 (90 Base) MCG/ACT inhaler Inhale 1-2 puffs into the lungs every 6 (six) hours as needed for wheezing or shortness of breath.  Marland Kitchen atorvastatin (LIPITOR) 10 MG tablet Take 1 tablet (10 mg total) by mouth daily.  . cyclobenzaprine (FLEXERIL) 10 MG tablet Take 1 tablet (10 mg total) by mouth 2 (two) times daily as needed for muscle spasms.  Marland Kitchen dicyclomine (BENTYL) 20 MG tablet Take 1 tablet (20 mg total) by mouth 4 (four) times daily -  before meals and at bedtime for 15 days.  . Dulaglutide (TRULICITY) 1.5 ZS/0.1UX SOPN Inject 1.5 mg into the skin once a week.   Marland Kitchen glimepiride (AMARYL) 4 MG tablet Take 2 tablets (8 mg total) by mouth daily with breakfast.  . ibuprofen (ADVIL) 600 MG tablet Take 1 tablet (600 mg total) by mouth every 6 (six) hours as needed.  Marland Kitchen lisinopril (ZESTRIL) 40 MG tablet Take 1 tablet (40 mg total) by mouth daily.  . metFORMIN (GLUCOPHAGE) 1000 MG tablet Take 1 tablet (1,000 mg total) by mouth 2 (two) times daily with a meal.  . mometasone-formoterol (DULERA) 200-5 MCG/ACT AERO Inhale 2 puffs into the lungs 2 (  two) times daily.  Marland Kitchen omeprazole (PRILOSEC) 40 MG capsule Take 1 capsule (40 mg total) by mouth daily as needed (for GERD).  Marland Kitchen thiamine (VITAMIN B-1) 100 MG tablet Take 1 tablet (100 mg total) by mouth daily.  Marland Kitchen tiotropium (SPIRIVA) 18 MCG inhalation capsule Place 1 capsule (18 mcg total) into inhaler and inhale daily.     Allergies:   Banana   ROS:   Please see the history of present illness.    All other systems reviewed and are negative.   Labs/Other Tests and Data Reviewed:    Recent Labs: 12/26/2018: ALT 31; BUN 12; Creatinine, Ser 0.74; Hemoglobin 14.6; Platelets  343; Potassium 3.8; Sodium 137   Recent Lipid Panel No results found for: CHOL, TRIG, HDL, CHOLHDL, LDLCALC, LDLDIRECT  Wt Readings from Last 3 Encounters:  03/16/19 248 lb (112.5 kg)  02/18/19 250 lb (113.4 kg)  01/26/19 276 lb 1.9 oz (125.2 kg)     Objective:    Vital Signs:  Ht 5\' 9"  (1.753 m)   Wt 248 lb (112.5 kg)   BMI 36.62 kg/m    GEN:  alert and oriented RESPIRATORY:  no shortness of breath in conversation PSYCH:  flat affect and depressed mood  Depression screen [facility] 2/9 03/16/2019 01/26/2019  Decreased Interest 0 0  Down, Depressed, Hopeless 0 1  PHQ - 2 Score 0 1     ASSESSMENT & PLAN:   1. Bipolar 1 disorder (HCC)  - Ambulatory referral to Behavioral Health  2. Lumbar degenerative disc disease  - Ambulatory referral to Orthopedic Surgery   Time:   Today, I have spent 10 minutes with the patient with telehealth technology discussing the above problems.     Medication Adjustments/Labs and Tests Ordered: Current medicines are reviewed at length with the patient today.  Concerns regarding medicines are outlined above.   Tests Ordered: No orders of the defined types were placed in this encounter.   Medication Changes: No orders of the defined types were placed in this encounter.   Disposition:  Follow up 04/26/2019 Signed, 06/26/2019, NP  03/16/2019 10:00 AM     03/18/2019 [facility]

## 2019-03-17 ENCOUNTER — Encounter: Payer: Self-pay | Admitting: Family Medicine

## 2019-03-17 DIAGNOSIS — F319 Bipolar disorder, unspecified: Secondary | ICD-10-CM | POA: Insufficient documentation

## 2019-03-17 DIAGNOSIS — M5136 Other intervertebral disc degeneration, lumbar region: Secondary | ICD-10-CM | POA: Insufficient documentation

## 2019-03-17 NOTE — Assessment & Plan Note (Signed)
Newly reported history and need for medication. Receptive to referral for proper assessment and treatment. Referral made for [facility] Appreciate collaboration in his care, please let PCP know if we can do anything. Denies depression, SI, HI at this time

## 2019-03-17 NOTE — Assessment & Plan Note (Addendum)
Do not recall this in the first visit. Needs in person assessment this is phone so limited. Referral to Ortho made for better work up.  Appreciate collaboration in his care.

## 2019-03-28 ENCOUNTER — Other Ambulatory Visit: Payer: Self-pay

## 2019-03-28 DIAGNOSIS — E119 Type 2 diabetes mellitus without complications: Secondary | ICD-10-CM

## 2019-03-28 MED ORDER — GLIMEPIRIDE 4 MG PO TABS: 8.0000 mg | ORAL_TABLET | Freq: Every day | ORAL | 3 refills | Status: DC

## 2019-03-30 ENCOUNTER — Telehealth: Payer: Self-pay

## 2019-03-30 ENCOUNTER — Other Ambulatory Visit: Payer: Self-pay

## 2019-03-30 DIAGNOSIS — K219 Gastro-esophageal reflux disease without esophagitis: Secondary | ICD-10-CM

## 2019-03-30 DIAGNOSIS — J4540 Moderate persistent asthma, uncomplicated: Secondary | ICD-10-CM

## 2019-03-30 DIAGNOSIS — E119 Type 2 diabetes mellitus without complications: Secondary | ICD-10-CM

## 2019-03-30 DIAGNOSIS — I10 Essential (primary) hypertension: Secondary | ICD-10-CM

## 2019-03-30 MED ORDER — GLIMEPIRIDE 4 MG PO TABS: 8.0000 mg | ORAL_TABLET | Freq: Every day | ORAL | 3 refills | Status: AC

## 2019-03-30 MED ORDER — OMEPRAZOLE 40 MG PO CPDR: 40.0000 mg | DELAYED_RELEASE_CAPSULE | Freq: Every day | ORAL | 3 refills | Status: AC | PRN

## 2019-03-30 MED ORDER — ATORVASTATIN CALCIUM 10 MG PO TABS: 10.0000 mg | ORAL_TABLET | Freq: Every day | ORAL | 1 refills | Status: AC

## 2019-03-30 MED ORDER — METFORMIN HCL 1000 MG PO TABS: 1000.0000 mg | ORAL_TABLET | Freq: Two times a day (BID) | ORAL | 3 refills | Status: AC

## 2019-03-30 MED ORDER — ALBUTEROL SULFATE HFA 108 (90 BASE) MCG/ACT IN AERS: 1.0000 | INHALATION_SPRAY | Freq: Four times a day (QID) | RESPIRATORY_TRACT | 3 refills | Status: AC | PRN

## 2019-03-30 MED ORDER — TIOTROPIUM BROMIDE MONOHYDRATE 18 MCG IN CAPS: 18.0000 ug | ORAL_CAPSULE | Freq: Every day | RESPIRATORY_TRACT | 3 refills | Status: AC

## 2019-03-30 MED ORDER — LISINOPRIL 40 MG PO TABS: 40.0000 mg | ORAL_TABLET | Freq: Every day | ORAL | 1 refills | Status: DC

## 2019-03-30 MED ORDER — DULERA 200-5 MCG/ACT IN AERO: 2.0000 | INHALATION_SPRAY | Freq: Two times a day (BID) | RESPIRATORY_TRACT | 3 refills | Status: AC

## 2019-03-30 NOTE — Telephone Encounter (Signed)
All medications prescribed by Tereasa Coop NP sent to pharmacy

## 2019-03-30 NOTE — Telephone Encounter (Signed)
Please send in all Medication

## 2019-04-19 ENCOUNTER — Other Ambulatory Visit: Payer: Self-pay | Admitting: *Deleted

## 2019-04-19 DIAGNOSIS — I10 Essential (primary) hypertension: Secondary | ICD-10-CM

## 2019-04-19 MED ORDER — LISINOPRIL 40 MG PO TABS: 40.0000 mg | ORAL_TABLET | Freq: Every day | ORAL | 1 refills | Status: AC

## 2019-04-26 ENCOUNTER — Ambulatory Visit: Payer: Self-pay | Admitting: Family Medicine

## 2019-11-16 IMAGING — DX DG ABDOMEN ACUTE W/ 1V CHEST
4 series · 4 of 4 positions shown · non-contrast
Comparison: CT abdomen and pelvis of 12/10/2018

CLINICAL DATA: Abdominal pain for 1 month, nausea vomiting.

EXAM:
DG ABDOMEN ACUTE W/ 1V CHEST

[chest pa]
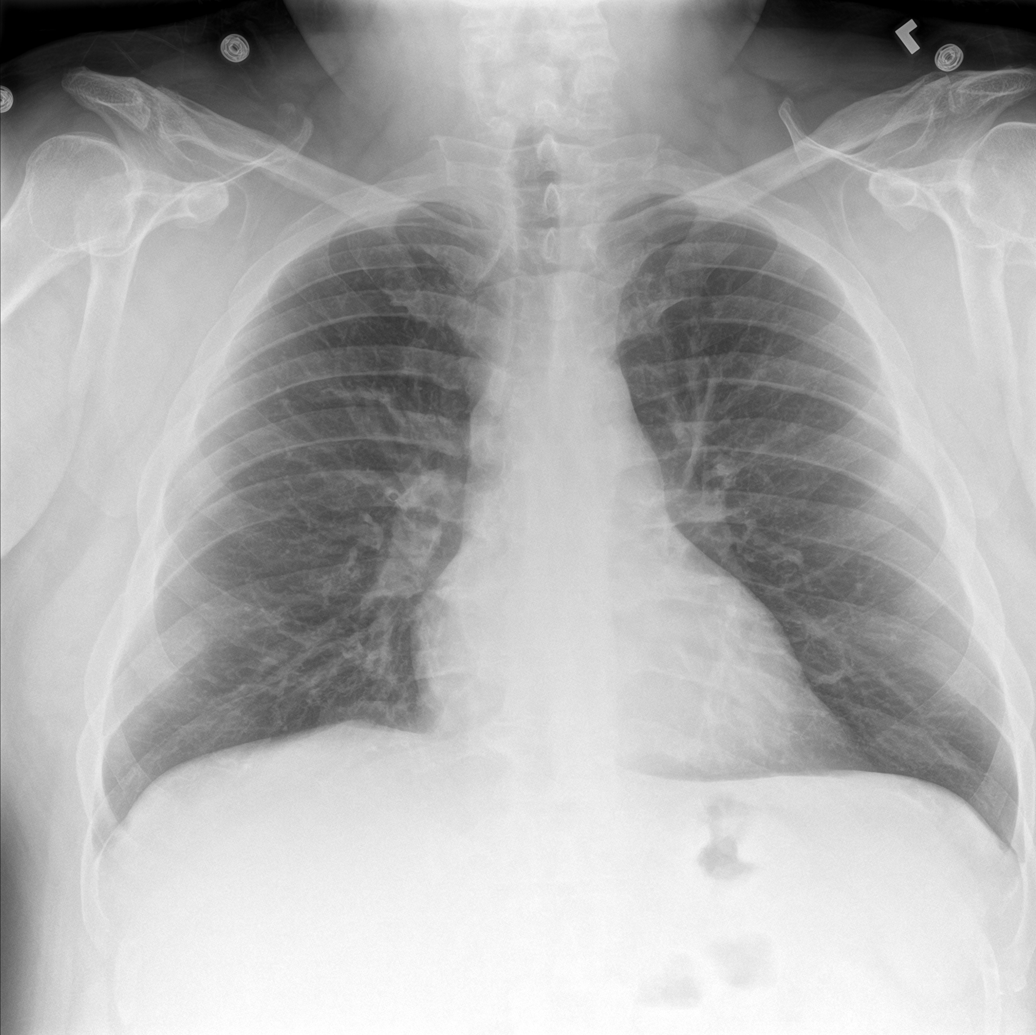

[abdomen erect]
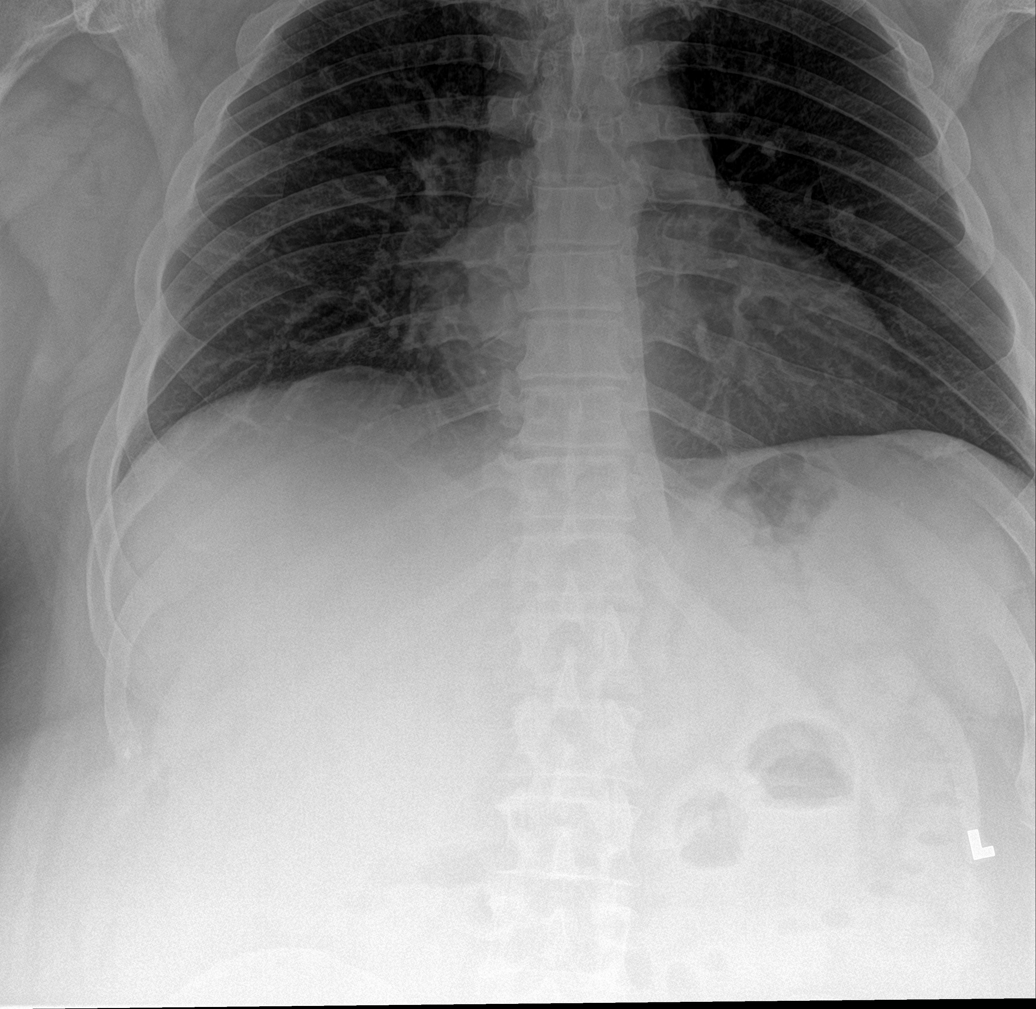

[abdomen supine (1 of 2)]
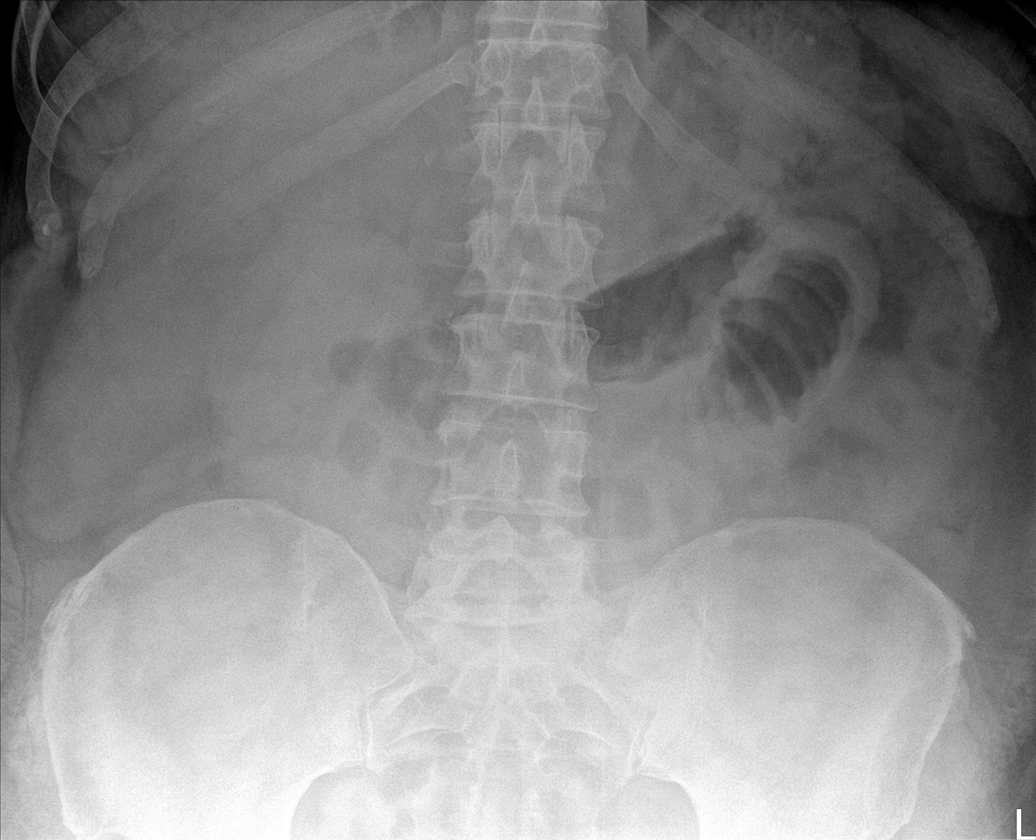

[abdomen supine (2 of 2)]
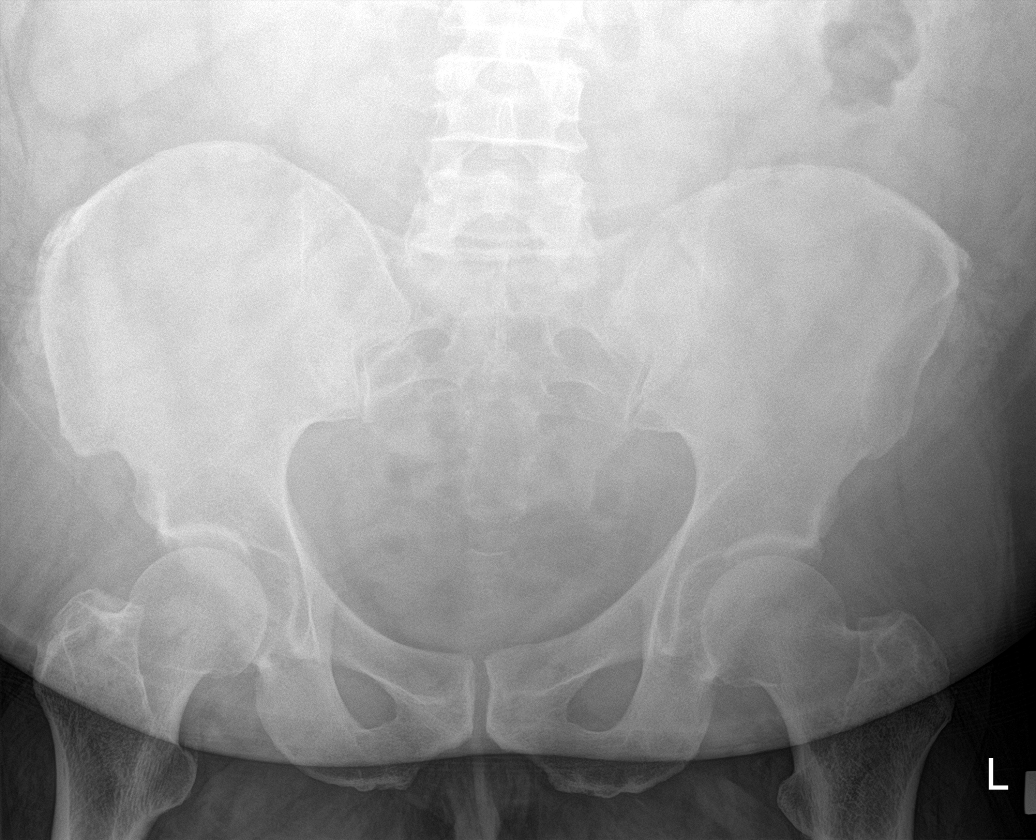

[4 of 4 positions shown; findings below may reference images not displayed]

FINDINGS: Lungs are clear. Cardiomediastinal contours and pulmonary
vasculature are normal.

No free air beneath either right or left hemidiaphragm.

Signs of moderately dilated small bowel in the central abdomen up to
5 cm. Minimal gas seen throughout the abdomen. No colonic gas.
Air-fluid levels noted with string of pearls sign in the left
hemiabdomen.

No acute bone process.
IMPRESSION: Findings suspicious for small bowel obstruction.

No acute cardiopulmonary disease.

No signs of free air.
